# Patient Record
Sex: Female | Born: 1945 | Race: White | Hispanic: No | Marital: Married | Smoking: Never smoker
Health system: Southern US, Community
[De-identification: ages and names within clinical notes are randomized; demographics above are authoritative.]

## PROBLEM LIST (undated history)

## (undated) DIAGNOSIS — H547 Unspecified visual loss: Secondary | ICD-10-CM

## (undated) DIAGNOSIS — C801 Malignant (primary) neoplasm, unspecified: Secondary | ICD-10-CM

## (undated) DIAGNOSIS — E785 Hyperlipidemia, unspecified: Secondary | ICD-10-CM

## (undated) DIAGNOSIS — I728 Aneurysm of other specified arteries: Secondary | ICD-10-CM

## (undated) DIAGNOSIS — I10 Essential (primary) hypertension: Secondary | ICD-10-CM

## (undated) DIAGNOSIS — Z923 Personal history of irradiation: Secondary | ICD-10-CM

## (undated) HISTORY — DX: Hyperlipidemia, unspecified: E78.5

## (undated) HISTORY — DX: Aneurysm of other specified arteries: I72.8

## (undated) HISTORY — PX: EYE SURGERY: SHX253

## (undated) HISTORY — DX: Unspecified visual loss: H54.7

## (undated) HISTORY — DX: Essential (primary) hypertension: I10

## (undated) HISTORY — DX: Malignant (primary) neoplasm, unspecified: C80.1

## (undated) HISTORY — PX: BREAST LUMPECTOMY: SHX2

## (undated) HISTORY — PX: ABDOMINAL HYSTERECTOMY: SHX81

---

## 2000-07-15 ENCOUNTER — Ambulatory Visit (HOSPITAL_COMMUNITY): Admission: RE | Admit: 2000-07-15 | Discharge: 2000-07-15 | Payer: Self-pay | Admitting: Internal Medicine

## 2000-07-15 ENCOUNTER — Encounter: Payer: Self-pay | Admitting: Internal Medicine

## 2000-11-22 ENCOUNTER — Emergency Department (HOSPITAL_COMMUNITY): Admission: EM | Admit: 2000-11-22 | Discharge: 2000-11-22 | Payer: Self-pay | Admitting: Emergency Medicine

## 2000-11-22 ENCOUNTER — Encounter: Payer: Self-pay | Admitting: Emergency Medicine

## 2001-02-01 ENCOUNTER — Emergency Department (HOSPITAL_COMMUNITY): Admission: EM | Admit: 2001-02-01 | Discharge: 2001-02-01 | Payer: Self-pay | Admitting: Emergency Medicine

## 2003-03-18 ENCOUNTER — Ambulatory Visit (HOSPITAL_COMMUNITY): Admission: RE | Admit: 2003-03-18 | Discharge: 2003-03-18 | Payer: Self-pay | Admitting: Specialist

## 2003-03-27 ENCOUNTER — Inpatient Hospital Stay (HOSPITAL_COMMUNITY): Admission: EM | Admit: 2003-03-27 | Discharge: 2003-03-30 | Payer: Self-pay | Admitting: Emergency Medicine

## 2003-03-30 ENCOUNTER — Inpatient Hospital Stay (HOSPITAL_COMMUNITY): Admission: EM | Admit: 2003-03-30 | Discharge: 2003-04-01 | Payer: Self-pay | Admitting: Psychiatry

## 2004-05-20 HISTORY — PX: TONSILLECTOMY: SUR1361

## 2005-12-12 ENCOUNTER — Encounter (INDEPENDENT_AMBULATORY_CARE_PROVIDER_SITE_OTHER): Payer: Self-pay | Admitting: *Deleted

## 2005-12-12 ENCOUNTER — Observation Stay (HOSPITAL_COMMUNITY): Admission: RE | Admit: 2005-12-12 | Discharge: 2005-12-13 | Payer: Self-pay | Admitting: Otolaryngology

## 2007-02-17 ENCOUNTER — Emergency Department (HOSPITAL_COMMUNITY): Admission: EM | Admit: 2007-02-17 | Discharge: 2007-02-18 | Payer: Self-pay | Admitting: Emergency Medicine

## 2007-07-03 ENCOUNTER — Encounter: Admission: RE | Admit: 2007-07-03 | Discharge: 2007-07-03 | Payer: Self-pay | Admitting: Internal Medicine

## 2007-07-09 ENCOUNTER — Encounter: Admission: RE | Admit: 2007-07-09 | Discharge: 2007-07-09 | Payer: Self-pay | Admitting: Internal Medicine

## 2007-07-16 ENCOUNTER — Encounter (INDEPENDENT_AMBULATORY_CARE_PROVIDER_SITE_OTHER): Payer: Self-pay | Admitting: Diagnostic Radiology

## 2007-07-16 ENCOUNTER — Other Ambulatory Visit: Admission: RE | Admit: 2007-07-16 | Discharge: 2007-07-16 | Payer: Self-pay | Admitting: Diagnostic Radiology

## 2007-07-16 ENCOUNTER — Encounter: Admission: RE | Admit: 2007-07-16 | Discharge: 2007-07-16 | Payer: Self-pay | Admitting: Internal Medicine

## 2008-08-08 ENCOUNTER — Emergency Department (HOSPITAL_COMMUNITY): Admission: EM | Admit: 2008-08-08 | Discharge: 2008-08-08 | Payer: Self-pay | Admitting: Emergency Medicine

## 2008-08-17 ENCOUNTER — Ambulatory Visit (HOSPITAL_COMMUNITY): Admission: RE | Admit: 2008-08-17 | Discharge: 2008-08-17 | Payer: Self-pay | Admitting: Internal Medicine

## 2008-09-07 ENCOUNTER — Encounter: Admission: RE | Admit: 2008-09-07 | Discharge: 2008-09-07 | Payer: Self-pay | Admitting: Internal Medicine

## 2010-01-21 ENCOUNTER — Emergency Department (HOSPITAL_COMMUNITY): Admission: EM | Admit: 2010-01-21 | Discharge: 2010-01-21 | Payer: Self-pay | Admitting: Emergency Medicine

## 2010-01-21 ENCOUNTER — Encounter (INDEPENDENT_AMBULATORY_CARE_PROVIDER_SITE_OTHER): Payer: Self-pay | Admitting: Emergency Medicine

## 2010-01-21 ENCOUNTER — Ambulatory Visit: Payer: Self-pay | Admitting: Vascular Surgery

## 2010-05-20 DIAGNOSIS — Z923 Personal history of irradiation: Secondary | ICD-10-CM

## 2010-05-20 DIAGNOSIS — C50919 Malignant neoplasm of unspecified site of unspecified female breast: Secondary | ICD-10-CM

## 2010-05-20 HISTORY — DX: Personal history of irradiation: Z92.3

## 2010-05-20 HISTORY — PX: BREAST LUMPECTOMY: SHX2

## 2010-05-20 HISTORY — DX: Malignant neoplasm of unspecified site of unspecified female breast: C50.919

## 2010-08-02 LAB — BASIC METABOLIC PANEL
BUN: 17 mg/dL (ref 6–23)
CO2: 24 mEq/L (ref 19–32)
Chloride: 107 mEq/L (ref 96–112)
Creatinine, Ser: 0.98 mg/dL (ref 0.4–1.2)
GFR calc non Af Amer: 57 mL/min — ABNORMAL LOW (ref >60)
Glucose, Bld: 116 mg/dL — ABNORMAL HIGH (ref 70–99)
Potassium: 4.1 mEq/L (ref 3.5–5.1)

## 2010-08-02 LAB — URINE MICROSCOPIC-ADD ON

## 2010-08-02 LAB — CBC
HCT: 40.1 % (ref 36.0–46.0)
MCHC: 34.1 g/dL (ref 30.0–36.0)
MCV: 86.4 fL (ref 78.0–100.0)
Platelets: 242 10*3/uL (ref 150–400)
RDW: 14 % (ref 11.5–15.5)

## 2010-08-02 LAB — URINALYSIS, ROUTINE W REFLEX MICROSCOPIC
Leukocytes, UA: NEGATIVE
Nitrite: NEGATIVE
pH: 5 (ref 5.0–8.0)

## 2010-08-02 LAB — DIFFERENTIAL
Eosinophils Relative: 1 % (ref 0–5)
Lymphocytes Relative: 6 % — ABNORMAL LOW (ref 12–46)

## 2010-08-30 LAB — DIFFERENTIAL
Basophils Relative: 0 % (ref 0–1)
Eosinophils Relative: 2 % (ref 0–5)
Lymphocytes Relative: 15 % (ref 12–46)
Lymphs Abs: 1.6 10*3/uL (ref 0.7–4.0)
Neutro Abs: 8.3 10*3/uL — ABNORMAL HIGH (ref 1.7–7.7)
Neutrophils Relative %: 76 % (ref 43–77)

## 2010-08-30 LAB — URINALYSIS, ROUTINE W REFLEX MICROSCOPIC
Bilirubin Urine: NEGATIVE
Leukocytes, UA: NEGATIVE
Nitrite: NEGATIVE
Protein, ur: NEGATIVE mg/dL

## 2010-08-30 LAB — POCT I-STAT, CHEM 8
BUN: 13 mg/dL (ref 6–23)
Chloride: 103 mEq/L (ref 96–112)
HCT: 44 % (ref 36.0–46.0)
Potassium: 3.4 mEq/L — ABNORMAL LOW (ref 3.5–5.1)
Sodium: 138 mEq/L (ref 135–145)

## 2010-08-30 LAB — CBC
Hemoglobin: 14 g/dL (ref 12.0–15.0)
RDW: 13.6 % (ref 11.5–15.5)
WBC: 10.9 10*3/uL — ABNORMAL HIGH (ref 4.0–10.5)

## 2010-08-30 LAB — URINE MICROSCOPIC-ADD ON

## 2010-10-05 NOTE — H&P (Signed)
NAME:  [name], [name]                         ACCOUNT NO.:  [id number]   MEDICAL RECORD NO.:  [id number]                   PATIENT TYPE:  OUT   LOCATION:  [facility]                                 FACILITY:  [facility]   PHYSICIAN:  Alfonse Spruce, M.D.               DATE OF BIRTH:  06/30/1934   DATE OF ADMISSION:  DATE OF DISCHARGE:                                HISTORY & PHYSICAL   CHIEF COMPLAINT:  The patient took at home 84 pills of  lisinopril/hydrochlorothiazide, the 20/12.5.   HISTORY OF PRESENT ILLNESS:  [name] is a 65 year old white  female, married.  She has been very upset with her daughter who has recently  left her husband and came to live with her mom and dad, and she has not been  helping in the housework at home, as well as she has been late at night  outside the home.  The patient was very upset with the daughter.  She stated  that besides other things, that threw her to be in that state of mind, was  the low self esteem, felt hopeless to get anything done with the daughter.  She was not getting along with her husband;  however, lately she has been  okay with him at this time.   PAST MEDICAL HISTORY:  1. History of hypertension.  2. History of several eye drops, including cataract removal and glaucoma and     eye drops.   MEDICATIONS:  1. Lisinopril 20/12.5 mg one tablet q. daily.  2. Timolol 0.5% one drop each eye twice a day.  3. Pilocarpine hydrochloric 4% one drop three times a day.   HABITS:  Nonsmoker, nondrinker.   GYNECOLOGICAL HISTORY:  She is gravida 3, para 2, with one missed abortion.  Her son is 62, her daughter is 64, who returned back home and she was  married.  She is the only child.  She has no sisters or brothers.   FAMILY HISTORY:  The father died at the age of 67 with pneumonia, and mother  died at the age of 67 with a stroke.   ALLERGIES:  Unknown.   REVIEW OF SYSTEMS:  NEUROPSYCHIATRIC:  It appears the patient is depressed  with severe low self esteem and was hopeless, and ended by suicidal attempt  with the lisinopril.  No previous attempt of suicide in the past.  CARDIOVASCULAR:  No chest pain, no shortness of breath, no palpitations.  She has a history of hypertension.  PULMONARY:  She had no cough or  expectoration, no history of pneumonia.  GASTROINTESTINAL:  No hematemesis,  no melena, no hematochezia.  No history of peptic ulcer disease.  GENITOURINARY:  No dysuria or hematuria.  She has currently a Foley catheter  which is draining well with the IV fluids.  MUSCULOSKELETAL:  She was fine.  HEENT:  The eyes mainly with the vision problem and eye  drops.  The rest of  the review of the systems was essentially negative.   PHYSICAL EXAMINATION:  HEENT:  Head was normocephalic, atraumatic.  Pupils  could not be elicited with the several surgeries in the eyes.  Oropharynx  was negative with the color of the charcoal as she took two tubes of it when  she arrived.  VITAL SIGNS:  Her blood pressure was 165/60, pulse ox 99, respiratory rate  16, and pulse 80, temperature 98.6.  NECK:  Supple, no JVD, no thyromegaly, no lymphadenopathy.  CHEST:  Clear to auscultation and percussion.  HEART:  __________ with the PMI in the first intercostal space with normal  S1 and S2, no S3, no S4, no gallop.  ABDOMEN:  Soft, nontender, positive bowel sounds, no epigastric tenderness.  EXTREMITIES:  No pedal edema, pulsatile pulses with normal motion.  NEUROLOGIC:  Grossly intact.  Cranial nerves II-XII were intact.  Motor and  sensory were intact.   LABORATORY DATA:  Laboratory in the ER was sodium 137, potassium 4.2,  chloride 104, carbon dioxide 27, BUN 23, creatinine 1, blood sugar 129.  Liver enzymes within normal limits.  White count 7.9, hemoglobin 12.8,  hematocrit 38.4.   IMPRESSION:  1. Overdose on Lisinopril, hypertensive medication.  2. Hypertension.  3. History of glaucoma with using eye drops.   The  emergency room has contacted the Poison Control.   PLAN:  The patient already received two tubes of charcoal and subsequently  IV fluids which will be monitored in the intensive care unit/step-down  intensive care unit for possible hypotension.  Hopefully, if she did well by  tomorrow we will be able to move her out and ACT team was called in to see  the patient, and the future psychiatry will be a consultation and  stabilization of the patient if needed.                                               Alfonse Spruce, M.D.    Wynn Maudlin  D:  03/27/2003  T:  03/27/2003  Job:  [id number]   cc:   Janae Bridgeman. Eloise Harman., M.D.  [address]  [place]  [place] [address]  Fax: [phone]

## 2010-10-05 NOTE — Discharge Summary (Signed)
NAME:  [name], [name]                         ACCOUNT NO.:  [id number]   MEDICAL RECORD NO.:  [id number]                   PATIENT TYPE:  IPS   LOCATION:  0306                                 FACILITY:  [facility]   PHYSICIAN:  Jeanice Lim, M.D.              DATE OF BIRTH:  19-May-1946   DATE OF ADMISSION:  03/30/2003  DATE OF DISCHARGE:  04/01/2003                                 DISCHARGE SUMMARY   IDENTIFYING DATA:  This is a 65 year old Caucasian female voluntarily  admitted after an overdose on 84 tablets of lisinopril.  The patient  reported being upset after arguing with daughter, believed that she is a  Magazine features editor.  Upset that her daughter had told her, naming names, that she had  slept with 12 people and that her brief marriage did not work and this was  too much for the patient to take, although she has already gained some  insight regarding having to let her daughter control her own behavior.   MEDICATIONS:  Timolol, lisinopril and metoprolol.   ALLERGIES:  No known drug allergies.   PHYSICAL EXAMINATION:  Within normal limits.  Neurologically nonfocal.   LABORATORY DATA:  Routine admission labs within normal limits.   MENTAL STATUS EXAM:  Fully alert, in no acute distress.  Pleasant,  cooperative, tearful.  Appropriate affect.  Speech within normal limits.  Mood mildly depressed but determined to manage increasingly healthy insight  and coping skills.  Thought processes goal directed.  No acute suicidal  ideation or psychotic symptoms.  Cognitively intact.   ADMISSION DIAGNOSES:   AXIS I:  1. Adjustment disorder with mixed emotions.  2. Rule out depression not otherwise specified.   AXIS II:  Deferred.   AXIS III:  1. Status post ACE inhibitor overdose.  2. Glaucoma.  3. Hypertension.   AXIS IV:  Severe (conflict with primary support system, essentially with  tolerating daughter's behavior who lives with patient and husband).   AXIS V:  35/70.   HOSPITAL  COURSE:  The patient was admitted and ordered routine p.r.n.  medications and underwent further monitoring.  Was encouraged to participate  in individual, group and milieu therapy.  The patient reported regretting  overdose, feeling stupid, ashamed, upset at daughter's promiscuity but felt  that she had now opened up and had contacted her support system, including  her pastor, and husband was also supportive and the patient demonstrated  good insight and problem-solving skills, showing appropriate remorse and  exhibiting no acute significant depressive symptoms or history of clinical  depression or mood swings.  The patient underwent monitoring and, after no  dangerous ideation, dangerous behavior, or acute risk issues, patient was  discharged in improved condition.  Mood was more stable.  Affect brighter.  Thought processes goal directed.  Thought content negative for dangerous  ideation or psychotic symptoms.  The patient reported motivation to get  counseling.   DISCHARGE  MEDICATIONS:  The patient was discharged on no psychotropics.  Indicated that patient was to continue medical medications as previously  prescribed.  The patient and husband would be receiving therapy with the  church counselor within 3-4 days.   DISCHARGE DIAGNOSES:   AXIS I:  1. Adjustment disorder with mixed emotions.  2. Rule out depression not otherwise specified.   AXIS II:  Deferred.   AXIS III:  1. Status post ACE inhibitor overdose.  2. Glaucoma.  3. Hypertension.   AXIS IV:  Severe (conflict with primary support system, essentially with  tolerating daughter's behavior who lives with patient and husband).   AXIS V:  Global Assessment of Functioning on discharge 60.                                               Jeanice Lim, M.D.    JEM/MEDQ  D:  04/24/2003  T:  04/25/2003  Job:  [id number]

## 2010-10-05 NOTE — Discharge Summary (Signed)
   NAME:  [name], [name]                      ACCOUNT NO.:  [id number]   MEDICAL RECORD NO.:  [id number]                   PATIENT TYPE:  INP   LOCATION:  0451                                 FACILITY:  [facility]   PHYSICIAN:  Alfonse Spruce, M.D.               DATE OF BIRTH:  [birth year]   DATE OF ADMISSION:  03/27/2003  DATE OF DISCHARGE:                                 DISCHARGE SUMMARY   FINAL DIAGNOSES:  1. Suicide attempt with lisinopril.  2. Underlying history of hypertension, currently normotensive with no     medication.  3. History of cataract and glaucoma on eyedrops, using at home.   The patient at risk still per psychiatry and will be transferred to the  rehab unit per Dr. Jeanie Sewer, the psychiatrist.   HOSPITAL COURSE:  The patient is a 65 year old white female admitted after  she had a suicide attempt with swallowing lisinopril, hydrochlorothiazide,  and she had a history of hypertension in the past.  She swallowed 84 pills.  The patient was observed in the ICU followed by the medical floor and her  blood pressure stayed stable in the normotensive state and she is conscious,  alert, oriented x3, ambulatory.  She had underlying mild hypokalemia and  will be supplemented with potassium 20 mEq once a day and subsequently on  discharge will be getting only potassium two doses and then needs to be  checked subsequently on the rehab unit.  The patient currently on no  medication and the patient will receive only medication for eyedrops and two  doses of K-Dur 20 mEq equal to 40 mEq prior to discharge.  The patient  currently stable medically; however, per psychiatric evaluation needs  further inpatient rehabilitation on behavior and psychiatry.  The patient  will be on a regular diet and regular activity.  The patient will be  receiving two tablets of K-Dur each 20 mEq with a total of 40 mEq and  recommendation to be followed with serum BMET in the behavioral unit and if  needed, potassium needs to be added at that time.   DISPOSITION:  Discharge to behavioral and psychiatric floor.                                               Alfonse Spruce, M.D.    Wynn Maudlin  D:  03/29/2003  T:  03/29/2003  Job:  [id number]

## 2010-10-05 NOTE — Op Note (Signed)
NAMEAUDREENA, Walton               ACCOUNT NO.:  [id number]   MEDICAL RECORD NO.:  [id number]          PATIENT TYPE:  OBV   LOCATION:  3314                         FACILITY:  [facility]   PHYSICIAN:  Kristine Garbe. Ezzard Standing, M.D.DATE OF BIRTH:  March 09, 1946   DATE OF PROCEDURE:  12/12/2005  DATE OF DISCHARGE:                                 OPERATIVE REPORT   PREOPERATIVE DIAGNOSES:  1.  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  2.  Turbinate hypertrophy.  3.  Tonsil hypertrophy with obstructive breathing at night.   POSTOPERATIVE DIAGNOSES:  1.  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  2.  Turbinate hypertrophy.  3.  Tonsil hypertrophy with obstructive breathing at night.   OPERATIONS:  1.  Septoplasty with bilateral inferior turbinate reductions.  2.  Tonsillectomy.   SURGEON:  Kristine Garbe. Ezzard Standing, MD   ANESTHESIA:  General endotracheal.   COMPLICATIONS:  None.   BRIEF CLINICAL NOTE:  [name] is a 65 year old female who complains of  a nasal obstruction, right side worse than left, as well as noisy breathing.  At night she tends to snore and frequently wakes up.  She has clinical  symptoms of obstructive sleep apnea; although, she did not want to have a  sleep test performed.  On exam, she has a deviation of the septum to the  right with narrow right nasal passageway.  She has moderate-large cryptic  tonsils and a long uvula.  She is taken to the operating room at this time  for septoplasty, turbinate reductions, and tonsillectomy.   DESCRIPTION OF PROCEDURE:  On back with endotracheal anesthesia.  The  patient received 1 gm Ancef IV preoperatively, as well as 2 mg of Decadron.  Nose was then prepped with Betadine solution, draped down with sterile  towels, and septum was injected was Xylocaine with epinephrine.  The patient  had a deviation of the cartilaginous and bony crest to the right.  Mucoperichondrial and mucoperiosteal flaps were  elevated on the right side.  The portion of the cartilaginous septum and bony crest that bowed to the  right side were removed and the crest was fractured back to midline.  This  completed the septoplasty portion of the procedure.  The right inferior  turbinate was amputated halfway down and suction cautery was used for  hemostasis.  On the left, compensatory hypertrophy of the left inferior  turbinate, submucosal section of bone was removed and the submucosal  cauterization was performed and the turbinate was outfractured.  This  completed the nasal portion of the procedure.  Telfa soaked in bacitracin  ointment was packed in both sides of the nose.  The patient was then turned,  the mouth gag was used to displaced the oropharynx.  The patient had cryptic  tonsils with a large amount of white debris within the tonsillar crypts.  Tonsils were resected of tonsil processes bilaterally with the cautery.  After excising the tonsils, the uvula which was slightly enlarged and  elongated was amputated at its base.  This completed the procedure.  Iverna  was awoken from anesthesia  and transferred to the recovery room  postoperatively doing well.   DISPOSITION:  [name] will be observed overnight for any breathing difficulty  and planned discharge in the morning after removing nasal packs.  She will  be discharged home on amoxicillin suspension 500 mg b.i.d. for one week,  Tylenol/Lortab Elixir 1-to-1.5 tablespoons every 4 hours p.r.n. pain.  We  will have her follow up in my office in two weeks for recheck.  Of note, on  patient's preoperative chest x-ray, there is a questionable new 1-cm nodule  in the right lung base and it was recommended that she have a nonenhanced CT  scan to further evaluate this.  We will plan on scheduling this either prior  to discharge or on followup in the office.           ______________________________  Kristine Garbe. Ezzard Standing, M.D.     CEN/MEDQ  D:  12/12/2005  T:   12/13/2005  Job:  [id number]   cc:   Janae Bridgeman. Eloise Harman., M.D.  Fax: [phone]

## 2011-01-29 ENCOUNTER — Other Ambulatory Visit: Payer: Self-pay | Admitting: Internal Medicine

## 2011-01-29 DIAGNOSIS — N6320 Unspecified lump in the left breast, unspecified quadrant: Secondary | ICD-10-CM

## 2011-02-01 ENCOUNTER — Other Ambulatory Visit: Payer: Self-pay | Admitting: Diagnostic Radiology

## 2011-02-01 ENCOUNTER — Ambulatory Visit
Admission: RE | Admit: 2011-02-01 | Discharge: 2011-02-01 | Disposition: A | Discharge status: Home / Self Care | Payer: Medicare Other | Source: Ambulatory Visit | Attending: Internal Medicine | Admitting: Internal Medicine

## 2011-02-01 ENCOUNTER — Other Ambulatory Visit: Payer: Self-pay | Admitting: Internal Medicine

## 2011-02-01 DIAGNOSIS — N6320 Unspecified lump in the left breast, unspecified quadrant: Secondary | ICD-10-CM

## 2011-02-04 ENCOUNTER — Other Ambulatory Visit: Payer: Self-pay | Admitting: Internal Medicine

## 2011-02-04 DIAGNOSIS — C50912 Malignant neoplasm of unspecified site of left female breast: Secondary | ICD-10-CM

## 2011-02-08 ENCOUNTER — Ambulatory Visit
Admission: RE | Admit: 2011-02-08 | Discharge: 2011-02-08 | Disposition: A | Discharge status: Home / Self Care | Payer: Medicare Other | Source: Ambulatory Visit | Attending: Internal Medicine | Admitting: Internal Medicine

## 2011-02-08 DIAGNOSIS — C50912 Malignant neoplasm of unspecified site of left female breast: Secondary | ICD-10-CM

## 2011-02-08 MED ORDER — GADOBENATE DIMEGLUMINE 529 MG/ML IV SOLN
20.0000 mL | Freq: Once | INTRAVENOUS | Status: AC | PRN
  Administered 2011-02-08: 20 mL via INTRAVENOUS

## 2011-02-13 ENCOUNTER — Encounter (INDEPENDENT_AMBULATORY_CARE_PROVIDER_SITE_OTHER): Payer: Self-pay | Admitting: General Surgery

## 2011-02-13 ENCOUNTER — Encounter (HOSPITAL_BASED_OUTPATIENT_CLINIC_OR_DEPARTMENT_OTHER): Payer: Medicare Other | Admitting: Oncology

## 2011-02-13 ENCOUNTER — Other Ambulatory Visit (INDEPENDENT_AMBULATORY_CARE_PROVIDER_SITE_OTHER): Payer: Self-pay | Admitting: General Surgery

## 2011-02-13 ENCOUNTER — Ambulatory Visit: Payer: Medicare Other | Admitting: Physical Therapy

## 2011-02-13 ENCOUNTER — Other Ambulatory Visit: Payer: Self-pay | Admitting: Oncology

## 2011-02-13 ENCOUNTER — Ambulatory Visit (HOSPITAL_BASED_OUTPATIENT_CLINIC_OR_DEPARTMENT_OTHER): Payer: Medicare Other | Admitting: General Surgery

## 2011-02-13 VITALS — BP 111/72 | HR 69 | Temp 98.5°F | Resp 20 | Ht 65.0 in | Wt 217.4 lb

## 2011-02-13 DIAGNOSIS — C50119 Malignant neoplasm of central portion of unspecified female breast: Secondary | ICD-10-CM

## 2011-02-13 DIAGNOSIS — Z170 Estrogen receptor positive status [ER+]: Secondary | ICD-10-CM

## 2011-02-13 DIAGNOSIS — C50912 Malignant neoplasm of unspecified site of left female breast: Secondary | ICD-10-CM

## 2011-02-13 DIAGNOSIS — C50112 Malignant neoplasm of central portion of left female breast: Secondary | ICD-10-CM | POA: Insufficient documentation

## 2011-02-13 LAB — CBC WITH DIFFERENTIAL/PLATELET
Basophils Absolute: 0 10*3/uL (ref 0.0–0.1)
HCT: 38.3 % (ref 34.8–46.6)
HGB: 13.1 g/dL (ref 11.6–15.9)
LYMPH%: 21.1 % (ref 14.0–49.7)
MCHC: 34.2 g/dL (ref 31.5–36.0)
MONO#: 0.4 10*3/uL (ref 0.1–0.9)
NEUT%: 67.8 % (ref 38.4–76.8)
Platelets: 261 10*3/uL (ref 145–400)
WBC: 8.1 10*3/uL (ref 3.9–10.3)
lymph#: 1.7 10*3/uL (ref 0.9–3.3)

## 2011-02-13 LAB — COMPREHENSIVE METABOLIC PANEL
BUN: 18 mg/dL (ref 6–23)
CO2: 30 mEq/L (ref 19–32)
Calcium: 9.4 mg/dL (ref 8.4–10.5)
Chloride: 105 mEq/L (ref 96–112)
Creatinine, Ser: 0.69 mg/dL (ref 0.50–1.10)
Glucose, Bld: 157 mg/dL — ABNORMAL HIGH (ref 70–99)
Total Bilirubin: 0.2 mg/dL — ABNORMAL LOW (ref 0.3–1.2)

## 2011-02-13 LAB — CANCER ANTIGEN 27.29: CA 27.29: 33 U/mL (ref 0–39)

## 2011-02-13 NOTE — Progress Notes (Signed)
Chief Complaint  Patient presents with  . Breast Cancer    HPI [name] is a 65 y.o. female.   HPI She is referred by Dr. Zella Ball for further evaluation and treatment of a newly diagnosed invasive mammary carcinoma of the left breast. She felt a lump at the 12:00 position of her left breast. She underwent a mammogram and ultrasound demonstrating the lesion. It was at the 12:00 position approximately 6 cm from the nipple. Large core needle biopsy was performed. It demonstrated grade 2 invasive mammary carcinoma. ER/PR positive. HER-2 negative. Proliferation rate 14%.  She has an aunt on her mother side who had breast cancer. The age of her first menstrual period was 68. First child born at age 22. She is menopausal secondary to her hysterectomy. Past Medical History  Diagnosis Date  . Blind   . Hypertension   . Hyperlipidemia   . Glaucoma   . Splenic artery aneurysm     8 mm    Past Surgical History  Procedure Date  . Tonsillectomy 2006  . Cesarean section     twice  . Abdominal hysterectomy   . Eye surgery     multiple    Family History  Problem Relation Age of Onset  . Blindness    . Breast cancer Maternal Aunt   . Cancer Paternal Grandmother     Social History History  Substance Use Topics  . Smoking status: Never Smoker   . Smokeless tobacco: Not on file  . Alcohol Use: No    Allergies  Allergen Reactions  . Zithromax (Azithromycin)     Current Outpatient Prescriptions  Medication Sig Dispense Refill  . latanoprost (XALATAN) 0.005 % ophthalmic solution       . lisinopril-hydrochlorothiazide (PRINZIDE,ZESTORETIC) 20-12.5 MG per tablet daily.       Marland Kitchen lovastatin (MEVACOR) 20 MG tablet daily.       . metoprolol (LOPRESSOR) 50 MG tablet 2 (two) times daily.       . timolol (TIMOPTIC) 0.5 % ophthalmic solution         Review of Systems Review of Systems  Constitutional: Negative.   HENT: Negative.   Eyes: Positive for visual disturbance.    Respiratory: Negative.   Cardiovascular: Negative.   Gastrointestinal: Negative.   Genitourinary: Negative.   Musculoskeletal: Negative.   Neurological: Negative.   Hematological: Negative.     Blood pressure 111/72, pulse 69, temperature 98.5 F (36.9 C), resp. rate 20, height 5\' 5"  (1.651 m), weight 217 lb 6.4 oz (98.612 kg).  Physical Exam Physical Exam  Constitutional: No distress.       Overweight.  HENT:  Head: Normocephalic and atraumatic.  Eyes:       Wears glasses.  Neck: Neck supple.  Cardiovascular: Normal rate and regular rhythm.   No murmur heard. Pulmonary/Chest: Effort normal and breath sounds normal.       In the left breast, there is a 1.5-2 cm palpable mass at the 12:00 position. There is some ecchymosis around this. In the right breast, no palpable masses or nipple discharge or suspicious skin changes.  Nodes-no cervical, supraclavicular, or axillary adenopathy.  Abdominal: Soft. She exhibits no distension and no mass. There is no tenderness.  Musculoskeletal: Normal range of motion. She exhibits no edema.  Lymphadenopathy:    She has no cervical adenopathy.  Neurological: She is alert.  Skin: Skin is warm and dry. No rash noted.  Psychiatric: She has a normal mood and affect. Her behavior  is normal.    Data Reviewed Mammogram, ultrasound, MRI, pathology report.  Assessment     Invasive mammary carcinoma of left breast that is newly diagnosed. I feel she is a good candidate for breast conservation and she agrees.    Plan    Left partial mastectomy and axillary sentinel lymph node biopsy.  I have explained the procedure, risks, and aftercare.  The risks include but are not limited to bleeding, infection, wound problems, seroma formation, anesthesia, nerve injury, lymphedema, need for reexcision or removal of more lymph nodes at a later time.  She seems to understand and agrees with the plan.       Nichola Warren J 02/13/2011, 12:19 PM

## 2011-02-13 NOTE — Patient Instructions (Signed)
My office will contact you to schedule surgery

## 2011-02-25 ENCOUNTER — Encounter (HOSPITAL_COMMUNITY)
Admission: RE | Admit: 2011-02-25 | Discharge: 2011-02-25 | Disposition: A | Discharge status: Home / Self Care | Payer: Medicare Other | Source: Ambulatory Visit | Attending: General Surgery | Admitting: General Surgery

## 2011-02-25 ENCOUNTER — Other Ambulatory Visit (INDEPENDENT_AMBULATORY_CARE_PROVIDER_SITE_OTHER): Payer: Self-pay | Admitting: General Surgery

## 2011-02-25 ENCOUNTER — Ambulatory Visit (HOSPITAL_COMMUNITY)
Admission: RE | Admit: 2011-02-25 | Discharge: 2011-02-25 | Disposition: A | Discharge status: Home / Self Care | Payer: Medicare Other | Source: Ambulatory Visit | Attending: General Surgery | Admitting: General Surgery

## 2011-02-25 DIAGNOSIS — Z01812 Encounter for preprocedural laboratory examination: Secondary | ICD-10-CM | POA: Insufficient documentation

## 2011-02-25 DIAGNOSIS — Z01810 Encounter for preprocedural cardiovascular examination: Secondary | ICD-10-CM | POA: Insufficient documentation

## 2011-02-25 DIAGNOSIS — C50912 Malignant neoplasm of unspecified site of left female breast: Secondary | ICD-10-CM

## 2011-02-25 DIAGNOSIS — I10 Essential (primary) hypertension: Secondary | ICD-10-CM | POA: Insufficient documentation

## 2011-02-25 DIAGNOSIS — Z01818 Encounter for other preprocedural examination: Secondary | ICD-10-CM | POA: Insufficient documentation

## 2011-02-25 LAB — SURGICAL PCR SCREEN
MRSA, PCR: NEGATIVE
Staphylococcus aureus: NEGATIVE

## 2011-02-28 ENCOUNTER — Ambulatory Visit (HOSPITAL_COMMUNITY)
Admission: RE | Admit: 2011-02-28 | Discharge: 2011-02-28 | Disposition: A | Discharge status: Home / Self Care | Payer: Medicare Other | Source: Ambulatory Visit | Attending: General Surgery | Admitting: General Surgery

## 2011-02-28 ENCOUNTER — Other Ambulatory Visit (INDEPENDENT_AMBULATORY_CARE_PROVIDER_SITE_OTHER): Payer: Self-pay | Admitting: General Surgery

## 2011-02-28 DIAGNOSIS — Z01818 Encounter for other preprocedural examination: Secondary | ICD-10-CM | POA: Insufficient documentation

## 2011-02-28 DIAGNOSIS — Z01812 Encounter for preprocedural laboratory examination: Secondary | ICD-10-CM | POA: Insufficient documentation

## 2011-02-28 DIAGNOSIS — Z01810 Encounter for preprocedural cardiovascular examination: Secondary | ICD-10-CM | POA: Insufficient documentation

## 2011-02-28 DIAGNOSIS — I10 Essential (primary) hypertension: Secondary | ICD-10-CM | POA: Insufficient documentation

## 2011-02-28 DIAGNOSIS — C50912 Malignant neoplasm of unspecified site of left female breast: Secondary | ICD-10-CM

## 2011-02-28 DIAGNOSIS — C50919 Malignant neoplasm of unspecified site of unspecified female breast: Secondary | ICD-10-CM

## 2011-02-28 DIAGNOSIS — C50119 Malignant neoplasm of central portion of unspecified female breast: Secondary | ICD-10-CM | POA: Insufficient documentation

## 2011-02-28 HISTORY — PX: BREAST SURGERY: SHX581

## 2011-02-28 LAB — BASIC METABOLIC PANEL
CO2: 29 mEq/L (ref 19–32)
Calcium: 9.7 mg/dL (ref 8.4–10.5)
GFR calc non Af Amer: 58 mL/min — ABNORMAL LOW (ref >90)
Potassium: 3.4 mEq/L — ABNORMAL LOW (ref 3.5–5.1)
Sodium: 143 mEq/L (ref 135–145)

## 2011-02-28 LAB — DIFFERENTIAL
Basophils Absolute: 0.1 10*3/uL (ref 0.0–0.1)
Basophils Relative: 1 % (ref 0–1)
Eosinophils Absolute: 0.3 10*3/uL (ref 0.0–0.7)
Monocytes Relative: 7 % (ref 3–12)
Neutro Abs: 5.5 10*3/uL (ref 1.7–7.7)
Neutrophils Relative %: 67 % (ref 43–77)

## 2011-02-28 LAB — CBC
Hemoglobin: 12.9 g/dL (ref 12.0–15.0)
MCH: 28.5 pg (ref 26.0–34.0)
Platelets: 268 10*3/uL (ref 150–400)
RBC: 4.53 MIL/uL (ref 3.87–5.11)
WBC: 8.3 10*3/uL (ref 4.0–10.5)

## 2011-02-28 MED ORDER — TECHNETIUM TC 99M SULFUR COLLOID FILTERED
1.0000 | Freq: Once | INTRAVENOUS | Status: AC | PRN
  Administered 2011-02-28: 1 via INTRADERMAL

## 2011-02-28 NOTE — Op Note (Signed)
[name], [name]               ACCOUNT NO.:  [id number]  MEDICAL RECORD NO.:  [id number]  LOCATION:  SDSC                         FACILITY:  [facility]  PHYSICIAN:  Adolph Pollack, M.D.DATE OF BIRTH:  [birth year]  DATE OF PROCEDURE:  02/28/2011 DATE OF DISCHARGE:                              OPERATIVE REPORT   PREOPERATIVE DIAGNOSIS:  Invasive mammary carcinoma of the left breast.  POSTOPERATIVE DIAGNOSIS:  Invasive mammary carcinoma of the left breast.  PROCEDURE:  Left axillary lymphatic mapping.  Left axillary sentinel lymph node biopsy.  Left partial mastectomy.  SURGEON:  Adolph Pollack, MD  ANESTHESIA:  General plus Marcaine local.  INDICATION:  [name] is a 65 year old female who felt a mass at the 12 o'clock position of her left breast.  Thorough evaluation was performed including a large core biopsy demonstrating invasive mammary carcinoma.  She now presents for the above procedure.  The procedure andrisks were discussed with her as well as the fact this would significantly help treat this disease.  TECHNIQUE:  I saw her in the holding room exam under and marked her left breast with my initials.  Injection of radioactive material was performed in the circumareolar area of the left breast.  She was then brought to the operating room, placed supine on the operating table. General anesthetic was administered.  Performed lymphatic mapping with the Neoprobe and noted area of increased counts in the inferior aspect of the left axilla.  The left breast, chest, axilla and proximal left arm was sterilely prepped and draped.  A transverse incision in the left axilla and carried through the subcutaneous tissue.  I used the Neoprobe and identified an area of increased counts.  I used blunt dissection to separate the subcutaneous tissues.  An enlarged palpable lymph node was noted with increased counts.  This was excised with electrocautery.  This was sent  as sentinel lymph node #1.  Adjacent to it was another enlarged lymph node that did not have increased counts and I excised this and sent this as an enlarged lymph node left axilla.  Following this, I put the Neoprobe back into the axilla and no increased counts were noted.  I controlled bleeding with electrocautery.  The tissue was anesthetized with lidocaine solution.  Once hemostasis was adequate, I then closed the subcutaneous tissue with interrupted 3-0 Vicryl sutures.  The skin was closed with a 4-0 Monocryl subcuticular stitch.  Next, I addressed the breast  lesion in 11-12 o'clock position. I made an elliptical incision around the skin just directly above the mass and extended it both medially and laterally.  I created a subcutaneous flaps using electrocautery down to the chest wall.  I then used the electrocautery to excise the breast mass a normal surrounding tissue.  After removal of the breast mass, I oriented it with a skin anterior, a single suture medial, double suture inferior.  I then performed a Faxitron and clipped from the biopsy site was in the middle of the mass.  This was sent to pathology.  I then inspected the wound and controlled bleeding with electrocautery. Once hemostasis was adequate, I then mobilized some of the breast tissue off the  muscle.  I then approximated the subcutaneous tissue, deep and superficially with interrupted 3-0 Vicryl sutures.  The skin was closed with a 4-0 Monocryl subcuticular stitch.  Local anesthetic was infiltrated.  Steri-Strips and sterile dressings were applied to both wounds.  She tolerated the procedures well without any apparent complications and was taken to recovery in satisfactory condition.     Adolph Pollack, M.D.     Kari Baars  D:  02/28/2011  T:  02/28/2011  Job:  [id number]  cc:   Drue Second, M.D. Lurline Hare, M.D.  Electronically Signed by Avel Peace M.D. on 02/28/2011 12:15:22 PM

## 2011-03-04 ENCOUNTER — Telehealth (INDEPENDENT_AMBULATORY_CARE_PROVIDER_SITE_OTHER): Payer: Self-pay | Admitting: General Surgery

## 2011-03-04 NOTE — Telephone Encounter (Signed)
Pathology was discussed with her, T2N0.  She is sore but otherwise doing well.  Will have her call and make a postoperative appointment.

## 2011-03-11 ENCOUNTER — Encounter (HOSPITAL_BASED_OUTPATIENT_CLINIC_OR_DEPARTMENT_OTHER): Payer: Medicare Other | Admitting: Oncology

## 2011-03-11 DIAGNOSIS — C50919 Malignant neoplasm of unspecified site of unspecified female breast: Secondary | ICD-10-CM

## 2011-03-11 DIAGNOSIS — H548 Legal blindness, as defined in USA: Secondary | ICD-10-CM

## 2011-03-13 ENCOUNTER — Ambulatory Visit
Admission: RE | Admit: 2011-03-13 | Discharge: 2011-03-13 | Disposition: A | Discharge status: Home / Self Care | Payer: Medicare Other | Source: Ambulatory Visit | Attending: Radiation Oncology | Admitting: Radiation Oncology

## 2011-03-13 DIAGNOSIS — Z510 Encounter for antineoplastic radiation therapy: Secondary | ICD-10-CM | POA: Insufficient documentation

## 2011-03-13 DIAGNOSIS — C50119 Malignant neoplasm of central portion of unspecified female breast: Secondary | ICD-10-CM | POA: Insufficient documentation

## 2011-03-19 ENCOUNTER — Encounter (INDEPENDENT_AMBULATORY_CARE_PROVIDER_SITE_OTHER): Payer: Self-pay | Admitting: General Surgery

## 2011-03-19 ENCOUNTER — Ambulatory Visit (INDEPENDENT_AMBULATORY_CARE_PROVIDER_SITE_OTHER): Payer: Medicare Other | Admitting: General Surgery

## 2011-03-19 VITALS — BP 124/66 | HR 80 | Temp 97.3°F | Resp 16 | Ht 65.0 in | Wt 214.8 lb

## 2011-03-19 DIAGNOSIS — C50919 Malignant neoplasm of unspecified site of unspecified female breast: Secondary | ICD-10-CM

## 2011-03-19 NOTE — Progress Notes (Signed)
Operation:  Left partial mastectomy and SLNBx  Date:   02/28/11  Stage: T2N0Mx, margins clear   Hormone receptor status:    HPI:  She is here for her first postop visit.  She is doing well and has no complaints.  PE:  Left breast and axillary incisioin are clean, dry and intact.  Assessment:  T2N0 left breast cancer s/p breast conservation  Plan:  Return visit one month.  Chemo and/or XRT dates to be determined.

## 2011-03-25 ENCOUNTER — Encounter: Payer: Self-pay | Admitting: Oncology

## 2011-03-25 NOTE — Progress Notes (Unsigned)
Received Oncotype Dx results of 13.  Gave copy to MD.

## 2011-03-27 ENCOUNTER — Telehealth: Payer: Self-pay | Admitting: *Deleted

## 2011-03-27 NOTE — Telephone Encounter (Signed)
Pt called states " I spoke with [place] and they said the test was finished and they sent them to Dr. Welton Flakes and she has them as of yesterday and she would call me today. I want to know if there's a chance of it coming back, do I need chemo or just radiation." Discussed with pt MD has been in clinic today and I am unsure at this point if she has seen these results however I will inform MD of her concerns regarding the tests.

## 2011-03-28 ENCOUNTER — Telehealth: Payer: Self-pay | Admitting: Oncology

## 2011-04-03 ENCOUNTER — Ambulatory Visit
Admission: RE | Admit: 2011-04-03 | Discharge: 2011-04-03 | Disposition: A | Discharge status: Home / Self Care | Payer: Medicare Other | Source: Ambulatory Visit | Attending: Radiation Oncology | Admitting: Radiation Oncology

## 2011-04-03 ENCOUNTER — Telehealth: Payer: Self-pay | Admitting: Radiation Oncology

## 2011-04-03 DIAGNOSIS — C50119 Malignant neoplasm of central portion of unspecified female breast: Secondary | ICD-10-CM

## 2011-04-03 NOTE — Telephone Encounter (Signed)
Met with patient to discuss RO billing. °

## 2011-04-03 NOTE — Progress Notes (Signed)
[facility] Radiation Oncology Simulation and Treatment Planning Note   Name: [name] MRN: [id number]  Date: 04/03/2011  DOB: 09/02/1945  Status:outpatient    DIAGNOSIS: Breast cancer   SIDE: left   CONSENT VERIFIED:yes   SET UP: Patient is setup supine   IMMOBILIZATION: Following immobilization is used:Custom Moldable Pillow   NARRATIVE:The patient was brought to the CT Simulation planning suite.  Identity was confirmed.  All relevant records and images related to the planned course of therapy were reviewed.  Then, the patient was positioned in a stable reproducible clinical set-up for radiation therapy.  CT images were obtained.  Skin markings were placed.  The CT images were loaded into the planning software where the target and avoidance structures were contoured.  The radiation prescription was entered and confirmed.   TREATMENT PLANNING NOTE:  Treatment planning then occurred. I have requested : MLC's, an isodose plan and dose calculation

## 2011-04-10 ENCOUNTER — Ambulatory Visit
Admission: RE | Admit: 2011-04-10 | Discharge: 2011-04-10 | Disposition: A | Discharge status: Home / Self Care | Payer: Medicare Other | Source: Ambulatory Visit | Attending: Radiation Oncology | Admitting: Radiation Oncology

## 2011-04-10 DIAGNOSIS — C50119 Malignant neoplasm of central portion of unspecified female breast: Secondary | ICD-10-CM

## 2011-04-15 ENCOUNTER — Ambulatory Visit
Admission: RE | Admit: 2011-04-15 | Discharge: 2011-04-15 | Disposition: A | Discharge status: Home / Self Care | Payer: Medicare Other | Source: Ambulatory Visit | Attending: Radiation Oncology | Admitting: Radiation Oncology

## 2011-04-16 ENCOUNTER — Ambulatory Visit
Admission: RE | Admit: 2011-04-16 | Discharge: 2011-04-16 | Disposition: A | Discharge status: Home / Self Care | Payer: Medicare Other | Source: Ambulatory Visit | Attending: Radiation Oncology | Admitting: Radiation Oncology

## 2011-04-16 ENCOUNTER — Encounter: Payer: Self-pay | Admitting: Radiation Oncology

## 2011-04-16 VITALS — BP 107/62 | HR 71 | Resp 18 | Wt 224.5 lb

## 2011-04-16 DIAGNOSIS — C50119 Malignant neoplasm of central portion of unspecified female breast: Secondary | ICD-10-CM

## 2011-04-16 NOTE — Progress Notes (Signed)
Weekly Management Note Current Dose: 3.6   Gy  Projected Dose:  61 Gy   Narrative:  The patient presents for routine under treatment assessment.  CBCT/MVCT images/Port film x-rays were reviewed.  The chart was checked. Doing well. Would like to have post sim ed tomorrow due to friend waiting for her.   Physical Findings: Weight: 224 lb 8 oz (101.833 kg). Unchanged. Alert and oriented.  Impression:  The patient is tolerating radiation.  Plan:  Continue treatment as planned. RN education tomorrow.

## 2011-04-16 NOTE — Progress Notes (Signed)
3:48 PM Patient presents to the clinic today unaccompanied for under treat visit with Dr. Michell Heinrich. No distress noted. Steady gait noted. Pleasant affect noted. Patient denies pain at this time. Patient has no complaints. Will schedule patient for post sim education.

## 2011-04-17 ENCOUNTER — Ambulatory Visit
Admission: RE | Admit: 2011-04-17 | Discharge: 2011-04-17 | Disposition: A | Discharge status: Home / Self Care | Payer: Medicare Other | Source: Ambulatory Visit | Attending: Radiation Oncology | Admitting: Radiation Oncology

## 2011-04-17 DIAGNOSIS — C50119 Malignant neoplasm of central portion of unspecified female breast: Secondary | ICD-10-CM

## 2011-04-17 MED ORDER — ALRA NON-METALLIC DEODORANT (RAD-ONC)
1.0000 "application " | Freq: Once | TOPICAL | Status: AC
  Administered 2011-04-17: 1 via TOPICAL

## 2011-04-17 MED ORDER — RADIAPLEXRX EX GEL
Freq: Once | CUTANEOUS | Status: AC
  Administered 2011-04-17: 1 via TOPICAL

## 2011-04-17 NOTE — Progress Notes (Signed)
Radiation therapy and you book, flyer on skin products, radiaplex gel, alra deodorant given to pt along with printed calender schedule for pt, pt gave verbal understanding, called pt's ride [place], [phone], pt ready to be picked up

## 2011-04-18 ENCOUNTER — Ambulatory Visit
Admission: RE | Admit: 2011-04-18 | Discharge: 2011-04-18 | Disposition: A | Discharge status: Home / Self Care | Payer: Medicare Other | Source: Ambulatory Visit | Attending: Radiation Oncology | Admitting: Radiation Oncology

## 2011-04-19 ENCOUNTER — Ambulatory Visit
Admission: RE | Admit: 2011-04-19 | Discharge: 2011-04-19 | Disposition: A | Discharge status: Home / Self Care | Payer: Medicare Other | Source: Ambulatory Visit | Attending: Radiation Oncology | Admitting: Radiation Oncology

## 2011-04-22 ENCOUNTER — Ambulatory Visit
Admission: RE | Admit: 2011-04-22 | Discharge: 2011-04-22 | Disposition: A | Discharge status: Home / Self Care | Payer: Medicare Other | Source: Ambulatory Visit | Attending: Radiation Oncology | Admitting: Radiation Oncology

## 2011-04-22 ENCOUNTER — Encounter (INDEPENDENT_AMBULATORY_CARE_PROVIDER_SITE_OTHER): Payer: Medicare Other | Admitting: General Surgery

## 2011-04-23 ENCOUNTER — Ambulatory Visit
Admission: RE | Admit: 2011-04-23 | Discharge: 2011-04-23 | Disposition: A | Discharge status: Home / Self Care | Payer: Medicare Other | Source: Ambulatory Visit | Attending: Radiation Oncology | Admitting: Radiation Oncology

## 2011-04-23 DIAGNOSIS — C50119 Malignant neoplasm of central portion of unspecified female breast: Secondary | ICD-10-CM

## 2011-04-23 NOTE — Progress Notes (Signed)
Weekly Management Note Current Dose: 12.6  Gy  Projected Dose: 61 Gy   Narrative:  The patient presents for routine under treatment assessment.  CBCT/MVCT images/Port film x-rays were reviewed.  The chart was checked. Felt a "lump" superior to her scar and was concerned this could be a radiation induced cancer after reading about that. Having problems sleeping on that side at night.   Physical Findings: Weight: 220 lb 12.8 oz (100.154 kg). Unchanged physical exam.  Dense and hard scar tissue palpable in and around her scar.   Impression:  The patient is tolerating radiation.  Plan:  Continue treatment as planned. Counseled patient on time course and extreme rarity of radiation induced cancers. What she is feeling is likely scar tissue.

## 2011-04-23 NOTE — Progress Notes (Signed)
Pt states she "found a lump on her L breast yesterday". States her "breast hurts sometimes". She states she is unsure if it was her entire breast or just the lump that hurts. Hurts when she lies on L side at night. She noticed "redness of breast that went away."

## 2011-04-24 ENCOUNTER — Ambulatory Visit
Admission: RE | Admit: 2011-04-24 | Discharge: 2011-04-24 | Disposition: A | Discharge status: Home / Self Care | Payer: Medicare Other | Source: Ambulatory Visit | Attending: Radiation Oncology | Admitting: Radiation Oncology

## 2011-04-25 ENCOUNTER — Ambulatory Visit
Admission: RE | Admit: 2011-04-25 | Discharge: 2011-04-25 | Disposition: A | Discharge status: Home / Self Care | Payer: Medicare Other | Source: Ambulatory Visit | Attending: Radiation Oncology | Admitting: Radiation Oncology

## 2011-04-26 ENCOUNTER — Ambulatory Visit
Admission: RE | Admit: 2011-04-26 | Discharge: 2011-04-26 | Disposition: A | Discharge status: Home / Self Care | Payer: Medicare Other | Source: Ambulatory Visit | Attending: Radiation Oncology | Admitting: Radiation Oncology

## 2011-04-29 ENCOUNTER — Ambulatory Visit
Admission: RE | Admit: 2011-04-29 | Discharge: 2011-04-29 | Disposition: A | Discharge status: Home / Self Care | Payer: Medicare Other | Source: Ambulatory Visit | Attending: Radiation Oncology | Admitting: Radiation Oncology

## 2011-04-30 ENCOUNTER — Ambulatory Visit
Admission: RE | Admit: 2011-04-30 | Discharge: 2011-04-30 | Disposition: A | Discharge status: Home / Self Care | Payer: Medicare Other | Source: Ambulatory Visit | Attending: Radiation Oncology | Admitting: Radiation Oncology

## 2011-04-30 DIAGNOSIS — C50119 Malignant neoplasm of central portion of unspecified female breast: Secondary | ICD-10-CM

## 2011-04-30 NOTE — Telephone Encounter (Signed)
I called the patient with her oncotype score.  I explained her pathology results, next step with Radiation treatment, and then anti-estrogen oral therapy.  Pt verbalized understanding.  I gave her my direct number for further questions.

## 2011-04-30 NOTE — Progress Notes (Signed)
12/25 fractions to left breast.  Denies any pain.  Mild erythema left breast..  C/o "some" fatigue and states she sits around and watches television more.

## 2011-04-30 NOTE — Progress Notes (Signed)
Weekly Management Note Current Dose:   Gy  Projected Dose:  Gy   Narrative:  The patient presents for routine under treatment assessment.  CBCT/MVCT images/Port film x-rays were reviewed.  The chart was checked. Doing well. No complaints.   Physical Findings: Weight: 218 lb 11.2 oz (99.202 kg). Unchanged  Impression:  The patient is tolerating radiation.  Plan:  Continue treatment as planned. Continue radiaplex.

## 2011-05-01 ENCOUNTER — Ambulatory Visit
Admission: RE | Admit: 2011-05-01 | Discharge: 2011-05-01 | Disposition: A | Discharge status: Home / Self Care | Payer: Medicare Other | Source: Ambulatory Visit | Attending: Radiation Oncology | Admitting: Radiation Oncology

## 2011-05-02 ENCOUNTER — Ambulatory Visit
Admission: RE | Admit: 2011-05-02 | Discharge: 2011-05-02 | Disposition: A | Discharge status: Home / Self Care | Payer: Medicare Other | Source: Ambulatory Visit | Attending: Radiation Oncology | Admitting: Radiation Oncology

## 2011-05-03 ENCOUNTER — Ambulatory Visit
Admission: RE | Admit: 2011-05-03 | Discharge: 2011-05-03 | Disposition: A | Discharge status: Home / Self Care | Payer: Medicare Other | Source: Ambulatory Visit | Attending: Radiation Oncology | Admitting: Radiation Oncology

## 2011-05-06 ENCOUNTER — Ambulatory Visit
Admission: RE | Admit: 2011-05-06 | Discharge: 2011-05-06 | Disposition: A | Discharge status: Home / Self Care | Payer: Medicare Other | Source: Ambulatory Visit | Attending: Radiation Oncology | Admitting: Radiation Oncology

## 2011-05-07 ENCOUNTER — Ambulatory Visit
Admission: RE | Admit: 2011-05-07 | Discharge: 2011-05-07 | Disposition: A | Discharge status: Home / Self Care | Payer: Medicare Other | Source: Ambulatory Visit | Attending: Radiation Oncology | Admitting: Radiation Oncology

## 2011-05-07 DIAGNOSIS — C50119 Malignant neoplasm of central portion of unspecified female breast: Secondary | ICD-10-CM

## 2011-05-07 NOTE — Progress Notes (Signed)
Completed 17 of 25 radiation treatments to left breast. Slightly pink. No concerns today.

## 2011-05-08 ENCOUNTER — Ambulatory Visit
Admission: RE | Admit: 2011-05-08 | Discharge: 2011-05-08 | Disposition: A | Discharge status: Home / Self Care | Payer: Medicare Other | Source: Ambulatory Visit | Attending: Radiation Oncology | Admitting: Radiation Oncology

## 2011-05-08 MED ORDER — RADIAPLEXRX EX GEL
Freq: Once | CUTANEOUS | Status: AC
  Administered 2011-05-08: 10:00:00 via TOPICAL

## 2011-05-08 NOTE — Progress Notes (Signed)
Encounter addended by: Tessa Lerner, RN on: 05/08/2011 10:21 AM<BR>     Documentation filed: Inpatient MAR

## 2011-05-08 NOTE — Progress Notes (Signed)
Encounter addended by: Tessa Lerner, RN on: 05/08/2011 10:18 AM<BR>     Documentation filed: Orders

## 2011-05-08 NOTE — Progress Notes (Signed)
DIAGNOSIS:  Left breast cancer.  NARRATIVE:  [name] is seen today for weekly assessment.  She has completed 3060 cGy of a planned 6100 cGy directed at the left breast area.  The patient has a sensitivity to the breast area, but no significant pain or pruritus.  PHYSICAL EXAMINATION:  Respiratory: The patient's lungs are clear. Cardiovascular: The heart has a regular rhythm and rate.  Breasts: The left breast area shows some erythema and hyperpigmentation changes, but no moist desquamation.  IMPRESSION AND PLAN:  The patient is tolerating her radiation treatments reasonably well.  The patient's radiation fields are setting up accurately.  The patient's radiation chart was checked today.  Plan is to continue to a cumulative dose of 6100 cGy as breast conservation therapy.    ______________________________ Billie Lade, Ph.D., M.D. JDK/MEDQ  D:  05/07/2011  T:  05/08/2011  Job:  [id number]

## 2011-05-09 ENCOUNTER — Ambulatory Visit
Admission: RE | Admit: 2011-05-09 | Discharge: 2011-05-09 | Disposition: A | Discharge status: Home / Self Care | Payer: Medicare Other | Source: Ambulatory Visit | Attending: Radiation Oncology | Admitting: Radiation Oncology

## 2011-05-10 ENCOUNTER — Ambulatory Visit
Admission: RE | Admit: 2011-05-10 | Discharge: 2011-05-10 | Disposition: A | Discharge status: Home / Self Care | Payer: Medicare Other | Source: Ambulatory Visit | Attending: Radiation Oncology | Admitting: Radiation Oncology

## 2011-05-13 ENCOUNTER — Ambulatory Visit
Admission: RE | Admit: 2011-05-13 | Discharge: 2011-05-13 | Disposition: A | Discharge status: Home / Self Care | Payer: Medicare Other | Source: Ambulatory Visit | Attending: Radiation Oncology | Admitting: Radiation Oncology

## 2011-05-15 ENCOUNTER — Ambulatory Visit
Admission: RE | Admit: 2011-05-15 | Discharge: 2011-05-15 | Disposition: A | Discharge status: Home / Self Care | Payer: Medicare Other | Source: Ambulatory Visit | Attending: Radiation Oncology | Admitting: Radiation Oncology

## 2011-05-15 ENCOUNTER — Encounter: Payer: Self-pay | Admitting: Radiation Oncology

## 2011-05-15 ENCOUNTER — Encounter: Payer: Self-pay | Admitting: *Deleted

## 2011-05-15 DIAGNOSIS — C50119 Malignant neoplasm of central portion of unspecified female breast: Secondary | ICD-10-CM

## 2011-05-15 NOTE — Progress Notes (Signed)
Pt seen in treatment area by Dr. Michell Heinrich, not sent to nursing, no c/o

## 2011-05-15 NOTE — Progress Notes (Signed)
Weekly Management Note Current Dose: 39.6  Gy  Projected Dose: 60.4 Gy   Narrative:  The patient presents for routine under treatment assessment.  CBCT/MVCT images/Port film x-rays were reviewed.  The chart was checked. Checked ebeam boost on machine. No complaints.   Physical Findings: Unchanged. Dermatitis medially.   Impression:  The patient is tolerating radiation.  Plan:  Continue treatment as planned. Can use hydrocortisone medially. Continue radiaplex.

## 2011-05-16 ENCOUNTER — Ambulatory Visit
Admission: RE | Admit: 2011-05-16 | Discharge: 2011-05-16 | Disposition: A | Discharge status: Home / Self Care | Payer: Medicare Other | Source: Ambulatory Visit | Attending: Radiation Oncology | Admitting: Radiation Oncology

## 2011-05-17 ENCOUNTER — Ambulatory Visit
Admission: RE | Admit: 2011-05-17 | Discharge: 2011-05-17 | Disposition: A | Discharge status: Home / Self Care | Payer: Medicare Other | Source: Ambulatory Visit | Attending: Radiation Oncology | Admitting: Radiation Oncology

## 2011-05-19 ENCOUNTER — Telehealth: Payer: Self-pay | Admitting: Oncology

## 2011-05-19 NOTE — Telephone Encounter (Signed)
sw the pt and she is aware of her feb 2013 appts

## 2011-05-20 ENCOUNTER — Ambulatory Visit
Admission: RE | Admit: 2011-05-20 | Discharge: 2011-05-20 | Disposition: A | Discharge status: Home / Self Care | Payer: Medicare Other | Source: Ambulatory Visit | Attending: Radiation Oncology | Admitting: Radiation Oncology

## 2011-05-20 ENCOUNTER — Ambulatory Visit: Payer: Medicare Other

## 2011-05-21 ENCOUNTER — Ambulatory Visit: Payer: Medicare Other

## 2011-05-22 ENCOUNTER — Ambulatory Visit
Admission: RE | Admit: 2011-05-22 | Discharge: 2011-05-22 | Disposition: A | Discharge status: Home / Self Care | Payer: Medicare Other | Source: Ambulatory Visit | Attending: Radiation Oncology | Admitting: Radiation Oncology

## 2011-05-22 DIAGNOSIS — C50119 Malignant neoplasm of central portion of unspecified female breast: Secondary | ICD-10-CM

## 2011-05-22 NOTE — Progress Notes (Signed)
C/O ITCHING , BURNING AND PAIN ON ALL AREAS OF TX, NOTED SMALL AREA OF DESQUAMATION UNDER BREAST.  USING RADIAPLEX.  ALSO C/O NEW DISCOMFORT UNDER ARM.

## 2011-05-22 NOTE — Progress Notes (Signed)
Weekly Management Note Current Dose: 47  Gy  Projected Dose: 61 Gy   Narrative:  The patient presents for routine under treatment assessment.  CBCT/MVCT images/Port film x-rays were reviewed.  The chart was checked. C/o irritaiton in axilla, small open sore underbreast and itching medially. Started boost today.   Physical Findings: Weight: 222 lb (100.699 kg). Moist desquamation under left breast. Dry desquamation in axilla. Some dermatitis medially.   Impression:  The patient is tolerating radiation.  Plan:  Continue treatment as planned. Hydrocortisone to medial breast for itching. Neosporin plus pain to inframammary fold. On boost now so hopefully skin will heal.

## 2011-05-23 ENCOUNTER — Ambulatory Visit
Admission: RE | Admit: 2011-05-23 | Discharge: 2011-05-23 | Disposition: A | Discharge status: Home / Self Care | Payer: Medicare Other | Source: Ambulatory Visit | Attending: Radiation Oncology | Admitting: Radiation Oncology

## 2011-05-24 ENCOUNTER — Ambulatory Visit
Admission: RE | Admit: 2011-05-24 | Discharge: 2011-05-24 | Disposition: A | Discharge status: Home / Self Care | Payer: Medicare Other | Source: Ambulatory Visit | Attending: Radiation Oncology | Admitting: Radiation Oncology

## 2011-05-27 ENCOUNTER — Ambulatory Visit
Admission: RE | Admit: 2011-05-27 | Discharge: 2011-05-27 | Disposition: A | Discharge status: Home / Self Care | Payer: Medicare Other | Source: Ambulatory Visit | Attending: Radiation Oncology | Admitting: Radiation Oncology

## 2011-05-28 ENCOUNTER — Ambulatory Visit
Admission: RE | Admit: 2011-05-28 | Discharge: 2011-05-28 | Disposition: A | Discharge status: Home / Self Care | Payer: Medicare Other | Source: Ambulatory Visit | Attending: Radiation Oncology | Admitting: Radiation Oncology

## 2011-05-28 DIAGNOSIS — C50119 Malignant neoplasm of central portion of unspecified female breast: Secondary | ICD-10-CM

## 2011-05-28 NOTE — Progress Notes (Signed)
Weekly Management Note Current Dose: 55  Gy  Projected Dose: 61 Gy   Narrative:  The patient presents for routine under treatment assessment.  CBCT/MVCT images/Port film x-rays were reviewed.  The chart was checked. Doing well. Moist desquamation feeling better. Using neosporin with pain.   Physical Findings: Weight: 223 lb 11.2 oz (101.47 kg). Unchanged  Impression:  The patient is tolerating radiation.  Plan:  Continue treatment as planned. F/u in 1 month. Discussed lotion with vit e. Has f/u with khan feb 8.

## 2011-05-28 NOTE — Progress Notes (Signed)
C/O PAIN 3/10 MORE ON BOOST AREA, NOTED BRIGHT RED SKIN WITH SOME WET DESQUAMATION UNDER BREAST, ALSO DRY DESQUAMATION UNDER ARM

## 2011-05-29 ENCOUNTER — Ambulatory Visit
Admission: RE | Admit: 2011-05-29 | Discharge: 2011-05-29 | Disposition: A | Discharge status: Home / Self Care | Payer: Medicare Other | Source: Ambulatory Visit | Attending: Radiation Oncology | Admitting: Radiation Oncology

## 2011-05-30 ENCOUNTER — Ambulatory Visit
Admission: RE | Admit: 2011-05-30 | Discharge: 2011-05-30 | Disposition: A | Discharge status: Home / Self Care | Payer: Medicare Other | Source: Ambulatory Visit | Attending: Radiation Oncology | Admitting: Radiation Oncology

## 2011-05-31 ENCOUNTER — Ambulatory Visit
Admission: RE | Admit: 2011-05-31 | Discharge: 2011-05-31 | Disposition: A | Discharge status: Home / Self Care | Payer: Medicare Other | Source: Ambulatory Visit | Attending: Radiation Oncology | Admitting: Radiation Oncology

## 2011-05-31 ENCOUNTER — Encounter: Payer: Self-pay | Admitting: Radiation Oncology

## 2011-05-31 DIAGNOSIS — C50119 Malignant neoplasm of central portion of unspecified female breast: Secondary | ICD-10-CM

## 2011-05-31 NOTE — Progress Notes (Signed)
Weekly Management Note Current Dose:  61 Gy  Projected Dose:  61 Gy   Narrative:  The patient presents for routine under treatment assessment.  CBCT/MVCT images/Port film x-rays were reviewed.  The chart was checked. Skin in boost is sore. Rest of breast is healing.   Physical Findings:   Erythema in boost area. Rest of breast is brown.   Impression:  The patient is tolerating radiation.  Plan:  Finishes RT today. F/u in 1 month.

## 2011-05-31 NOTE — Progress Notes (Signed)
Pt completed tx today, gave FU appt card. Per Dr Michell Heinrich, pt instructed to use radiaplex x 2 wks then buy lotion w/vit E. No c/o today.

## 2011-06-25 ENCOUNTER — Ambulatory Visit (INDEPENDENT_AMBULATORY_CARE_PROVIDER_SITE_OTHER): Payer: Medicare Other | Admitting: General Surgery

## 2011-06-25 ENCOUNTER — Encounter (INDEPENDENT_AMBULATORY_CARE_PROVIDER_SITE_OTHER): Payer: Self-pay | Admitting: General Surgery

## 2011-06-25 VITALS — BP 136/80 | HR 70 | Temp 97.8°F | Resp 18 | Ht 65.5 in | Wt 217.4 lb

## 2011-06-25 DIAGNOSIS — Z853 Personal history of malignant neoplasm of breast: Secondary | ICD-10-CM

## 2011-06-25 NOTE — Patient Instructions (Signed)
Call if you find any lumps in your breasts. 

## 2011-06-25 NOTE — Progress Notes (Signed)
Operation: Left partial mastectomy and sentinel lymph node biopsy  Date:  February 28, 2011  Stage: T2N0  Hormone receptor status:  Positive  HPI:  She is here for followup of her left breast cancer. She has completed her radiation treatments. She says of the scar is firm. No adenopathy.  PE: Left breast-superior scar is well healed with a firm ridge present. Radiation changes to the skin and noted.  Lymph nodes-no cervical, supraclavicular, or axillary adenopathy.  Assessment:  Left breast cancer status post breast conservation therapy and radiation therapy with no clinical evidence of recurrence.  Plan:  Return visit in 3 months.

## 2011-06-28 ENCOUNTER — Other Ambulatory Visit (HOSPITAL_BASED_OUTPATIENT_CLINIC_OR_DEPARTMENT_OTHER): Payer: Medicare Other | Admitting: Lab

## 2011-06-28 ENCOUNTER — Ambulatory Visit (HOSPITAL_BASED_OUTPATIENT_CLINIC_OR_DEPARTMENT_OTHER): Payer: Medicare Other | Admitting: Oncology

## 2011-06-28 VITALS — BP 103/68 | HR 66 | Temp 98.5°F | Ht 65.5 in | Wt 219.5 lb

## 2011-06-28 DIAGNOSIS — Z79811 Long term (current) use of aromatase inhibitors: Secondary | ICD-10-CM

## 2011-06-28 DIAGNOSIS — C50919 Malignant neoplasm of unspecified site of unspecified female breast: Secondary | ICD-10-CM

## 2011-06-28 DIAGNOSIS — Z170 Estrogen receptor positive status [ER+]: Secondary | ICD-10-CM

## 2011-06-28 LAB — CBC WITH DIFFERENTIAL/PLATELET
Basophils Absolute: 0 10*3/uL (ref 0.0–0.1)
Eosinophils Absolute: 0.2 10*3/uL (ref 0.0–0.5)
HGB: 13.2 g/dL (ref 11.6–15.9)
MONO#: 0.4 10*3/uL (ref 0.1–0.9)
NEUT#: 5.6 10*3/uL (ref 1.5–6.5)
Platelets: 248 10*3/uL (ref 145–400)
RBC: 4.59 10*6/uL (ref 3.70–5.45)
RDW: 14.4 % (ref 11.2–14.5)
WBC: 7.3 10*3/uL (ref 3.9–10.3)

## 2011-06-28 LAB — COMPREHENSIVE METABOLIC PANEL
Albumin: 3.9 g/dL (ref 3.5–5.2)
CO2: 26 mEq/L (ref 19–32)
Calcium: 9.3 mg/dL (ref 8.4–10.5)
Glucose, Bld: 120 mg/dL — ABNORMAL HIGH (ref 70–99)
Potassium: 3.8 mEq/L (ref 3.5–5.3)
Sodium: 141 mEq/L (ref 135–145)
Total Protein: 6.2 g/dL (ref 6.0–8.3)

## 2011-06-28 MED ORDER — ANASTROZOLE 1 MG PO TABS: 1.0000 mg | ORAL_TABLET | Freq: Every day | ORAL | Status: AC

## 2011-06-28 NOTE — Patient Instructions (Signed)

## 2011-06-29 ENCOUNTER — Encounter: Payer: Self-pay | Admitting: Radiation Oncology

## 2011-06-29 DIAGNOSIS — C50119 Malignant neoplasm of central portion of unspecified female breast: Secondary | ICD-10-CM

## 2011-06-29 NOTE — Progress Notes (Signed)
[facility] Radiation Oncology Simulation Verification Note   Name: [name] MRN: [id number]   Date: 04/10/2011 DOB: 1946/03/13  Status:outpatient   DIAGNOSIS:  1. 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     POSITION: Patient was placed in the supine position on the treatment machine.  Isocenter and MLCS were reviewed and treatment was approved.  NARRATIVE: Patient tolerated simulation well.

## 2011-06-29 NOTE — Progress Notes (Signed)
Name: [name]   MRN: [id number]  Date:  04/30/2011   DOB: 1946-04-05  Status:outpatient    DIAGNOSIS: Breast cancer.  CONSENT VERIFIED: yes   SET UP: Patient is setup supine   IMMOBILIZATION:  The following immobilization was used:Custom Moldable Pillow, breast board.   NARRATIVE: [name] underwent complex simulation and treatment planning for her boost treatment on 04/30/2011.  Her tumor volume was outlined on the planning CT scan. The dept of her cavity was felt to be adequate for electron beam boost.  18  MeV electrons will be prescribed to the 93% Isodose line.   A block will be used for beam modification purposes.  A special port plan is requested.

## 2011-06-29 NOTE — Progress Notes (Signed)
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  TREATMENT DATES:  04/15/2011 to 05/31/2011.  ANATOMIC REGION TREATED: 1. Left breast. 2. Left breast boost.  DOSE: 1. 45 Gy at 1.8 Gy per fraction x25 fractions. 2. 16 Gy at 2 Gy per fraction x8 fractions.  BEAM ARRANGEMENT: 1. Opposed tangents. 2. En face electrons.  BEAM ENERGY: 1. 6 and 10 MV photons. 2. 18 MeV electrons.  TREATMENT TOLERANCE:  Harlym really tolerated her treatments very well. She had the expected skin toxicity of dry desquamation.  FOLLOWUP:  I will plan on seeing her back in 1 month's time.  She also has regular scheduled followup with Dr. Abbey Chatters and Dr. Welton Flakes.  She knows to contact us with any questions or concerns.    ______________________________ Lurline Hare, M.D. SW/MEDQ  D:  06/29/2011  T:  06/29/2011  Job:  [id number]

## 2011-06-29 NOTE — Progress Notes (Signed)
OFFICE PROGRESS NOTE  CC  Pearson Grippe, MD, MD [address] [facility] [place] [address] Dr. Lysbeth Galas   DIAGNOSIS: 66 year old female with stage II breast cancer diagnosed 10/12 s/p partial mastectomy with SNL  PRIOR THERAPY: 1. Left partial mastectomy with SNL  2. Path revealed a stage 2 (T2No) IDC, ER+ 3. S/P radiation therapy to the left breast tolerated very well  4. Start adjuvant anti-estrogen therapy with arimidex 1 mg daily   CURRENT THERAPY: Arimidex daily (1 mg)  INTERVAL HISTORY: [name] 66 y.o. female returns for follow up visit overall she is doing well and is without any significant problems. She tolerated the radiation well. She does experience some tenderness in the surgical site. No denies any nausea or vomiting, no fevers or chills. She does have fatigue. Remainder of the 10 point review of systems is negative.  MEDICAL HISTORY: Past Medical History  Diagnosis Date  . Blind   . Hypertension   . Hyperlipidemia   . Glaucoma   . Splenic artery aneurysm     8 mm    ALLERGIES:  is allergic to zithromax.  MEDICATIONS:  Current Outpatient Prescriptions  Medication Sig Dispense Refill  . anastrozole (ARIMIDEX) 1 MG tablet Take 1 tablet (1 mg total) by mouth daily.  90 tablet  12  . latanoprost (XALATAN) 0.005 % ophthalmic solution Place 1 drop into both eyes 3 (three) times daily.       Marland Kitchen lisinopril-hydrochlorothiazide (PRINZIDE,ZESTORETIC) 20-12.5 MG per tablet 1 tablet daily.       Marland Kitchen lovastatin (MEVACOR) 20 MG tablet daily.       . metoprolol (LOPRESSOR) 50 MG tablet 50 mg 2 (two) times daily.       Marland Kitchen oxyCODONE-acetaminophen (PERCOCET) 5-325 MG per tablet 1 tablet as needed.       . timolol (TIMOPTIC) 0.5 % ophthalmic solution Place 1 drop into both eyes daily.         SURGICAL HISTORY:  Past Surgical History  Procedure Date  . Tonsillectomy 2006  . Cesarean section     twice  . Abdominal hysterectomy   .  Eye surgery     multiple  . Breast surgery 02/28/11    lumpectomy - left    REVIEW OF SYSTEMS:  Pertinent items are noted in HPI.   PHYSICAL EXAMINATION: General appearance: alert, cooperative and appears stated age Neck: no adenopathy, no carotid bruit, no JVD, supple, symmetrical, trachea midline and thyroid not enlarged, symmetric, no tenderness/mass/nodules Lymph nodes: Cervical, supraclavicular, and axillary nodes normal. Resp: clear to auscultation bilaterally and normal percussion bilaterally Back: symmetric, no curvature. ROM normal. No CVA tenderness. Cardio: regular rate and rhythm, S1, S2 normal, no murmur, click, rub or gallop GI: soft, non-tender; bowel sounds normal; no masses,  no organomegaly Extremities: extremities normal, atraumatic, no cyanosis or edema Neurologic: Alert and oriented X 3, normal strength and tone. Normal symmetric reflexes. Normal coordination and gait Breast exam: left well healed surgical scar, no palpable masses nipple discharge or inversion or retraction   ECOG PERFORMANCE STATUS: 1 - Symptomatic but completely ambulatory  Blood pressure 103/68, pulse 66, temperature 98.5 F (36.9 C), temperature source Oral, height 5' 5.5" (1.664 m), weight 219 lb 8 oz (99.565 kg).  LABORATORY DATA: Lab Results  Component Value Date   WBC 7.3 06/28/2011   HGB 13.2 06/28/2011   HCT 39.3 06/28/2011   MCV 85.7 06/28/2011   PLT 248 06/28/2011  Chemistry      Component Value Date/Time   NA 141 06/28/2011 0835   K 3.8 06/28/2011 0835   CL 105 06/28/2011 0835   CO2 26 06/28/2011 0835   BUN 19 06/28/2011 0835   CREATININE 0.89 06/28/2011 0835      Component Value Date/Time   CALCIUM 9.3 06/28/2011 0835   ALKPHOS 72 06/28/2011 0835   AST 17 06/28/2011 0835   ALT 15 06/28/2011 0835   BILITOT 0.6 06/28/2011 0835       RADIOGRAPHIC STUDIES:  No results found.  ASSESSMENT: 66 year old female with: 1.Left breast cancer stage II s/p partial mastectomy with SNL. Now completed  radiation    PLAN:  1. Arimidex 1 mg daily x 5 years 2.. Risks and benefits explained completely patient demonstrates understanding 3. Follow up in 3 months. Prescription sent to her pharmacy   All questions were answered. The patient knows to call the clinic with any problems, questions or concerns. We can certainly see the patient much sooner if necessary.  I spent 25 minutes counseling the patient face to face. The total time spent in the appointment was 30 minutes.    Drue Second, MD Medical/Oncology [facility] [phone] (beeper) [phone] (Office)  06/29/2011, 4:22 PM

## 2011-07-04 ENCOUNTER — Ambulatory Visit
Admission: RE | Admit: 2011-07-04 | Discharge: 2011-07-04 | Disposition: A | Discharge status: Home / Self Care | Payer: Medicare Other | Source: Ambulatory Visit | Attending: Radiation Oncology | Admitting: Radiation Oncology

## 2011-07-04 DIAGNOSIS — C50119 Malignant neoplasm of central portion of unspecified female breast: Secondary | ICD-10-CM

## 2011-07-04 NOTE — Progress Notes (Signed)
CC:   Adolph Pollack, M.D. Massie Maroon, MD Drue Second, M.D.  DIAGNOSIS:  T2 N0 invasive ductal carcinoma of the left breast.  PREVIOUS RADIATION:  Total dose of 61 Gy completed on 05/31/2011.  INTERVAL SINCE TREATMENT:  1 month.  INTERVAL HISTORY:  [name] reports for followup today.  She picked up her prescription for Femara and is going to start that later this afternoon. She has been using lotion with vitamin E to her breast.  She has no breast-related complaints.  PHYSICAL EXAMINATION:  Skin:  She has skin darkening over the left breast.  The skin appears moist.  IMPRESSION:  T2 invasive ductal carcinoma of the left breast with resolving acute effects of treatment.  RECOMMENDATIONS:  [name] looks great.  We will plan on seeing her back on a p.r.n. basis.  She knows she can always contact me with any questions or concerns.    ______________________________ Lurline Hare, M.D. SW/MEDQ  D:  07/04/2011  T:  07/04/2011  Job:  [id number]

## 2011-07-04 NOTE — Progress Notes (Signed)
Here for routine follow up post completion of left breast ca. Continues to have hyperpigmented skin. Using Vit. E lotion. Will start arimidex today.

## 2011-07-04 NOTE — Progress Notes (Signed)
CC:   Suzanne Maroon, MD Adolph Pollack, M.D. Drue Second, M.D.  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  PREVIOUS RADIATION:  To a total dose of 61 Gy completed 05/31/2011.  INTERVAL SINCE TREATMENT:  1 month.  INTERVAL HISTORY:  [name] reports for followup today.  She is feeling well and doing well.  She has picked up her Femara, but has not started it yet.  She is pleased with her cosmetic result.  She has no pain.  PHYSICAL EXAMINATION:  She has some hyperpigmentation of the left breast.  There is no moist desquamation.  IMPRESSION:  Left breast cancer with resolving acute effects of treatment.  RECOMMENDATIONS:  She is feeling well and doing well.  I have released her from followup with me.  She has regularly scheduled followup with Dr. Welton Flakes.  She knows to contact me with any questions or concerns.    ______________________________ Lurline Hare, M.D. SW/MEDQ  D:  07/04/2011  T:  07/04/2011  Job:  [id number]

## 2011-09-10 ENCOUNTER — Encounter: Payer: Self-pay | Admitting: Family

## 2011-09-10 ENCOUNTER — Other Ambulatory Visit (HOSPITAL_BASED_OUTPATIENT_CLINIC_OR_DEPARTMENT_OTHER): Payer: Medicare Other | Admitting: Lab

## 2011-09-10 ENCOUNTER — Telehealth: Payer: Self-pay | Admitting: Oncology

## 2011-09-10 ENCOUNTER — Ambulatory Visit (HOSPITAL_BASED_OUTPATIENT_CLINIC_OR_DEPARTMENT_OTHER): Payer: Medicare Other | Admitting: Family

## 2011-09-10 VITALS — BP 116/69 | HR 75 | Temp 97.9°F | Ht 65.5 in | Wt 219.2 lb

## 2011-09-10 DIAGNOSIS — C50919 Malignant neoplasm of unspecified site of unspecified female breast: Secondary | ICD-10-CM

## 2011-09-10 DIAGNOSIS — Z170 Estrogen receptor positive status [ER+]: Secondary | ICD-10-CM

## 2011-09-10 LAB — COMPREHENSIVE METABOLIC PANEL WITH GFR
ALT: 15 U/L (ref 0–35)
AST: 15 U/L (ref 0–37)
Albumin: 4.1 g/dL (ref 3.5–5.2)
Alkaline Phosphatase: 79 U/L (ref 39–117)
BUN: 22 mg/dL (ref 6–23)
CO2: 29 meq/L (ref 19–32)
Calcium: 9.4 mg/dL (ref 8.4–10.5)
Chloride: 103 meq/L (ref 96–112)
Creatinine, Ser: 1.04 mg/dL (ref 0.50–1.10)
Glucose, Bld: 166 mg/dL — ABNORMAL HIGH (ref 70–99)
Potassium: 4 meq/L (ref 3.5–5.3)
Sodium: 141 meq/L (ref 135–145)
Total Bilirubin: 0.5 mg/dL (ref 0.3–1.2)
Total Protein: 6.1 g/dL (ref 6.0–8.3)

## 2011-09-10 LAB — CBC WITH DIFFERENTIAL/PLATELET
Eosinophils Absolute: 0.2 10*3/uL (ref 0.0–0.5)
MONO#: 0.4 10*3/uL (ref 0.1–0.9)
MONO%: 5.2 % (ref 0.0–14.0)
NEUT#: 4.8 10*3/uL (ref 1.5–6.5)
RBC: 4.57 10*6/uL (ref 3.70–5.45)
RDW: 13.8 % (ref 11.2–14.5)
WBC: 6.7 10*3/uL (ref 3.9–10.3)
lymph#: 1.3 10*3/uL (ref 0.9–3.3)
nRBC: 0 % (ref 0–0)

## 2011-09-10 NOTE — Progress Notes (Signed)
  OFFICE PROGRESS NOTE  CC: Dr. Pearson Grippe Dr. Lysbeth Galas  DIAGNOSIS: Stage II left breast cancer diagnosed 10/12. Infiltrating ductal, ER+, 0/2 lymph nodes positive.   PRIOR THERAPY: 1. Left partial mastectomy with sentinel node biopsy 02/28/11.  2. Radiation therapy to the left breast   CURRENT THERAPY: Arimidex daily (1 mg) Began Feb. 2013.   INTERVAL HISTORY:  Returns for follow up visit, overall is doing well without significant problems.No headache or blurred vision. No cough or shortness of breath. No abdominal pain or new bone pain. Bowel and bladder function are normal. Appetite is good, with adequate fluid intake. Remainder of the 10 point  review of systems is negative.    ALLERGIES:  Zithromax.  PHYSICAL EXAMINATION:  General: Well developed, well nourished, in no acute distress.  EENT: No ocular or oral lesions. No stomatitis.  Respiratory: Lungs are clear to auscultation bilaterally with normal respiratory movement and no accessory muscle use. Cardiac: No murmur, rub or tachycardia. No upper or lower extremity edema.  GI: Abdomen is soft, no palpable hepatosplenomegaly. No fluid wave. No tenderness. Musculoskeletal: No kyphosis, no tenderness over the spine, ribs or hips. Lymph: No cervical, infraclavicular, axillary or inguinal adenopathy. Neuro: No focal neurological deficits. Psych: Alert and oriented X 3, flat affect.  Breast: BREAST EXAM: In the supine position, with the right arm over the head, the right nipple is everted. No periareolar edema or nipple discharge. No mass in any quadrant or subareolar region. No redness of the skin. No right axillary adenopathy. With the left arm over the head, the left nipple is everted. Skin thickening of left areola. No periareolar edema or nipple discharge. No mass in any quadrant or subareolar region.  In the 10 - 2 o'clock position, a 7 cm remote well-healed incision. Volume deficit underlying the scar. No redness of the  skin. No left axillary adenopathy. A 7 cm remote well-healed incision in the left axilla.   ECOG PERFORMANCE STATUS: O  Blood pressure 116/69, pulse 75, temperature 97.9 F (36.6 C), height 5' 5.5" (1.664 m), weight 219 lb 3.2 oz (99.428 kg).  LABORATORY DATA: Lab Results  Component Value Date   WBC 6.7 09/10/2011   HGB 13.5 09/10/2011   HCT 40.0 09/10/2011   MCV 87.4 09/10/2011   PLT 240 09/10/2011   ASSESSMENT: 66 year old female with: 1.History left breast cancer, no evidence of recurrence by clinical exam.   2. On antiestrogen for 3 months with good tolerance.   PLAN:  1. Continue Arimidex 1 mg daily x 5 years 2. Follow up in 4 months, no lab needed.  3. Baseline bone density and mammogram in August.   All questions were answered. The patient knows to call the clinic with any problems, questions or concerns.

## 2011-09-10 NOTE — Telephone Encounter (Signed)
gve the pt her aug 2013 appt calendar along with the mammo/bone density appt

## 2011-10-16 ENCOUNTER — Encounter (INDEPENDENT_AMBULATORY_CARE_PROVIDER_SITE_OTHER): Payer: Self-pay | Admitting: General Surgery

## 2011-10-16 ENCOUNTER — Ambulatory Visit (INDEPENDENT_AMBULATORY_CARE_PROVIDER_SITE_OTHER): Payer: Medicare Other | Admitting: General Surgery

## 2011-10-16 VITALS — BP 120/80 | HR 72 | Temp 96.2°F | Resp 16 | Ht 65.0 in | Wt 217.0 lb

## 2011-10-16 DIAGNOSIS — Z853 Personal history of malignant neoplasm of breast: Secondary | ICD-10-CM

## 2011-10-16 NOTE — Patient Instructions (Signed)
Call if you find any new masses in your breasts. 

## 2011-10-16 NOTE — Progress Notes (Signed)
Operation: Left partial mastectomy and sentinel lymph node biopsy  Date:  February 28, 2011  Stage: T2N0  Hormone receptor status:  Positive  HPI:  She is here for followup of her left breast cancer. She is tolerating the Arimidex well.  She still has the firm scar. No adenopathy.  PE: Left breast-superior scar is well healed with a firm ridge present. Radiation changes to the skin and noted.  No new masses.  Right breast-soft, no dominant masses or suspicious changes.  Lymph nodes-no cervical, supraclavicular, or axillary adenopathy.  Assessment:  Left breast cancer status post breast conservation therapy and radiation therapy with no clinical evidence of recurrence.  Plan:  Return visit in 3 months.

## 2012-01-16 ENCOUNTER — Encounter: Payer: Self-pay | Admitting: Oncology

## 2012-01-16 ENCOUNTER — Telehealth: Payer: Self-pay | Admitting: Oncology

## 2012-01-16 ENCOUNTER — Ambulatory Visit (HOSPITAL_BASED_OUTPATIENT_CLINIC_OR_DEPARTMENT_OTHER): Payer: Medicare Other | Admitting: Oncology

## 2012-01-16 VITALS — BP 108/70 | HR 78 | Temp 98.6°F | Resp 20 | Ht 65.0 in | Wt 219.8 lb

## 2012-01-16 DIAGNOSIS — C50919 Malignant neoplasm of unspecified site of unspecified female breast: Secondary | ICD-10-CM

## 2012-01-16 DIAGNOSIS — Z170 Estrogen receptor positive status [ER+]: Secondary | ICD-10-CM

## 2012-01-16 DIAGNOSIS — C50119 Malignant neoplasm of central portion of unspecified female breast: Secondary | ICD-10-CM

## 2012-01-16 NOTE — Progress Notes (Signed)
  OFFICE PROGRESS NOTE  CC: Dr. Pearson Grippe Dr. Avel Peace  DIAGNOSIS: Stage II left breast cancer diagnosed 10/12. Infiltrating ductal, ER+, 0/2 lymph nodes positive.   PRIOR THERAPY: 1. Left partial mastectomy with sentinel node biopsy 02/28/11.  2. Radiation therapy to the left breast   CURRENT THERAPY: Arimidex daily (1 mg) Began Feb. 2013.   INTERVAL HISTORY:  Returns for follow up visit, overall is doing well without significant problems.No headache or blurred vision. No cough or shortness of breath. No abdominal pain or new bone pain. Bowel and bladder function are normal. Appetite is good, with adequate fluid intake. Remainder of the 10 point  review of systems is negative.    ALLERGIES:  Zithromax.  PHYSICAL EXAMINATION:  General: Well developed, well nourished, in no acute distress.  EENT: No ocular or oral lesions. No stomatitis.  Respiratory: Lungs are clear to auscultation bilaterally with normal respiratory movement and no accessory muscle use. Cardiac: No murmur, rub or tachycardia. No upper or lower extremity edema.  GI: Abdomen is soft, no palpable hepatosplenomegaly. No fluid wave. No tenderness. Musculoskeletal: No kyphosis, no tenderness over the spine, ribs or hips. Lymph: No cervical, infraclavicular, axillary or inguinal adenopathy. Neuro: No focal neurological deficits. Psych: Alert and oriented X 3, flat affect.  Breast: BREAST EXAM: In the supine position, with the right arm over the head, the right nipple is everted. No periareolar edema or nipple discharge. No mass in any quadrant or subareolar region. No redness of the skin. No right axillary adenopathy. With the left arm over the head, the left nipple is everted. Skin thickening of left areola. No periareolar edema or nipple discharge. No mass in any quadrant or subareolar region.  In the 10 - 2 o'clock position, a 7 cm remote well-healed incision. Volume deficit underlying the scar. No redness of the  skin. No left axillary adenopathy. A 7 cm remote well-healed incision in the left axilla.    ECOG PERFORMANCE STATUS: O  Blood pressure 108/70, pulse 78, temperature 98.6 F (37 C), resp. rate 20, height 5\' 5"  (1.651 m), weight 219 lb 12.8 oz (99.701 kg).  LABORATORY DATA: Lab Results  Component Value Date   WBC 6.7 09/10/2011   HGB 13.5 09/10/2011   HCT 40.0 09/10/2011   MCV 87.4 09/10/2011   PLT 240 09/10/2011   ASSESSMENT: 66 year old female with: 1.History left breast cancer, no evidence of recurrence by clinical exam.   #2 patient is on Arimidex 1 mg daily overall she's tolerating it well she has no evidence of Clinical recurrence.  PLAN:  1. Continue Arimidex 1 mg daily x 5 years 2. Follow up in 6 months, with follow up labs at that time.  3. Baseline bone density and mammogram in September  All questions were answered. The patient knows to call the clinic with any problems, questions or concerns.   Drue Second, MD Medical/Oncology [facility] [phone] (beeper) [phone] (Office)  01/16/2012, 10:49 AM

## 2012-01-16 NOTE — Patient Instructions (Addendum)
Doing well Keep appointments for mammograms   I will see you back in 6 months

## 2012-01-16 NOTE — Telephone Encounter (Signed)
gve the pt her march 2014 appt calendar °

## 2012-01-22 ENCOUNTER — Encounter (INDEPENDENT_AMBULATORY_CARE_PROVIDER_SITE_OTHER): Payer: Self-pay | Admitting: General Surgery

## 2012-01-22 ENCOUNTER — Ambulatory Visit (INDEPENDENT_AMBULATORY_CARE_PROVIDER_SITE_OTHER): Payer: Medicare Other | Admitting: General Surgery

## 2012-01-22 VITALS — BP 104/56 | HR 64 | Temp 97.8°F | Resp 12 | Ht 65.0 in | Wt 215.6 lb

## 2012-01-22 DIAGNOSIS — Z853 Personal history of malignant neoplasm of breast: Secondary | ICD-10-CM

## 2012-01-22 NOTE — Progress Notes (Signed)
Operation: Left partial mastectomy and sentinel lymph node biopsy  Date:  February 28, 2011  Stage: T2N0  Hormone receptor status:  Positive  HPI:  She is here for followup of her left breast cancer. She continues to tolerate the Arimidex well.  She denies any masses or new pain in her breasts. No adenopathy.  PE: Left breast-superior scar is well healed and softer. Radiation changes to the skin are less obvious.  No new masses.  Right breast-soft, no dominant masses or suspicious changes.  Lymph nodes-no cervical, supraclavicular, or axillary adenopathy.  Assessment:  Left breast cancer status post breast conservation therapy and radiation therapy with no clinical evidence of recurrence.  Plan:  Return visit in 3 months.

## 2012-01-22 NOTE — Patient Instructions (Signed)
Call if you feel any new lumps in your breasts. 

## 2012-02-03 ENCOUNTER — Ambulatory Visit
Admission: RE | Admit: 2012-02-03 | Discharge: 2012-02-03 | Disposition: A | Discharge status: Home / Self Care | Payer: Medicare Other | Source: Ambulatory Visit | Attending: Family | Admitting: Family

## 2012-02-03 DIAGNOSIS — C50919 Malignant neoplasm of unspecified site of unspecified female breast: Secondary | ICD-10-CM

## 2012-02-12 ENCOUNTER — Telehealth: Payer: Self-pay | Admitting: Medical Oncology

## 2012-02-12 ENCOUNTER — Other Ambulatory Visit: Payer: Self-pay | Admitting: Family

## 2012-02-12 ENCOUNTER — Other Ambulatory Visit: Payer: Self-pay | Admitting: Oncology

## 2012-02-12 NOTE — Telephone Encounter (Signed)
Patient called requesting bone density results from 02/03/2012.  Will review with MD

## 2012-04-22 ENCOUNTER — Encounter (INDEPENDENT_AMBULATORY_CARE_PROVIDER_SITE_OTHER): Payer: Self-pay | Admitting: General Surgery

## 2012-04-22 ENCOUNTER — Other Ambulatory Visit: Payer: Self-pay | Admitting: *Deleted

## 2012-04-22 ENCOUNTER — Ambulatory Visit (INDEPENDENT_AMBULATORY_CARE_PROVIDER_SITE_OTHER): Payer: Medicare Other | Admitting: General Surgery

## 2012-04-22 VITALS — BP 122/78 | HR 74 | Temp 97.6°F | Resp 18 | Ht 65.0 in | Wt 212.0 lb

## 2012-04-22 DIAGNOSIS — C50319 Malignant neoplasm of lower-inner quadrant of unspecified female breast: Secondary | ICD-10-CM

## 2012-04-22 DIAGNOSIS — Z853 Personal history of malignant neoplasm of breast: Secondary | ICD-10-CM

## 2012-04-22 MED ORDER — ANASTROZOLE 1 MG PO TABS: 1.0000 mg | ORAL_TABLET | Freq: Every day | ORAL | Status: DC

## 2012-04-22 NOTE — Progress Notes (Signed)
Operation: Left partial mastectomy and sentinel lymph node biopsy  Date:  February 28, 2011  Stage: T2N0  Hormone receptor status:  Positive  HPI:  She is here for long term followup of her left breast cancer. She continues to tolerate the Arimidex well.  She denies any masses or new pain in her breasts. No adenopathy.  01/2012 MMG is a BIRADS 2.  PE: Left breast-superior scar is well healed and softer. Radiation changes to the skin are less obvious.  No new masses.  Right breast-soft, no dominant masses or suspicious changes.  Lymph nodes-no cervical, supraclavicular, or axillary adenopathy.  Assessment:  Left breast cancer status post breast conservation therapy and radiation therapy with no clinical or radiographic evidence of recurrence.  Plan:  Return visit in 3 months.

## 2012-04-22 NOTE — Patient Instructions (Signed)
Call if you find any lumps in your breasts.

## 2012-06-24 ENCOUNTER — Telehealth: Payer: Self-pay | Admitting: Oncology

## 2012-06-24 NOTE — Telephone Encounter (Signed)
Pt called today to r/s 3/28 appt. Pt given new appt for 4/14.

## 2012-07-27 ENCOUNTER — Ambulatory Visit (INDEPENDENT_AMBULATORY_CARE_PROVIDER_SITE_OTHER): Payer: Medicare Other | Admitting: General Surgery

## 2012-08-14 ENCOUNTER — Encounter (INDEPENDENT_AMBULATORY_CARE_PROVIDER_SITE_OTHER): Payer: Medicare Other | Admitting: General Surgery

## 2012-08-14 ENCOUNTER — Other Ambulatory Visit: Payer: Medicare Other | Admitting: Lab

## 2012-08-14 ENCOUNTER — Ambulatory Visit: Payer: Medicare Other | Admitting: Oncology

## 2012-08-18 ENCOUNTER — Telehealth: Payer: Self-pay | Admitting: *Deleted

## 2012-08-18 NOTE — Telephone Encounter (Signed)
Sw pt made her aware that KK will be on pal 08/31/12. gv her appt d/t for 10/30/12 @2 :30pm.

## 2012-08-31 ENCOUNTER — Other Ambulatory Visit: Payer: Self-pay | Admitting: Lab

## 2012-08-31 ENCOUNTER — Ambulatory Visit: Payer: Self-pay | Admitting: Oncology

## 2012-09-25 ENCOUNTER — Ambulatory Visit (INDEPENDENT_AMBULATORY_CARE_PROVIDER_SITE_OTHER): Payer: Medicare Other | Admitting: General Surgery

## 2012-09-25 ENCOUNTER — Encounter (INDEPENDENT_AMBULATORY_CARE_PROVIDER_SITE_OTHER): Payer: Self-pay | Admitting: General Surgery

## 2012-09-25 VITALS — BP 130/60 | HR 69 | Temp 98.6°F | Resp 18 | Ht 65.0 in | Wt 215.8 lb

## 2012-09-25 DIAGNOSIS — Z853 Personal history of malignant neoplasm of breast: Secondary | ICD-10-CM

## 2012-09-25 NOTE — Progress Notes (Signed)
Operation: Left partial mastectomy and sentinel lymph node biopsy  Date:  February 28, 2011  Stage: T2N0  Hormone receptor status:  Positive  HPI:  She is here for another long term followup visit for her left breast cancer. She continues to tolerate the Arimidex well.  She denies any masses or new pain in her breasts. No adenopathy.  01/2012 MMG is a BIRADS 2.  PE: Left breast-superior scar is well healed and softer. Radiation changes to the skin are less obvious.  No new masses.  Right breast-soft, no dominant masses or suspicious changes.  Lymph nodes-no cervical, supraclavicular, or axillary adenopathy.  Assessment:  Left breast cancer status post breast conservation therapy and radiation therapy with no clinical or radiographic evidence of recurrence.  Plan:  Return visit in 3 months.

## 2012-09-25 NOTE — Patient Instructions (Signed)
Call if you notice anything different with your breasts.

## 2012-10-30 ENCOUNTER — Encounter: Payer: Self-pay | Admitting: Oncology

## 2012-10-30 ENCOUNTER — Telehealth: Payer: Self-pay | Admitting: Oncology

## 2012-10-30 ENCOUNTER — Ambulatory Visit: Payer: Self-pay | Admitting: Oncology

## 2012-10-30 ENCOUNTER — Other Ambulatory Visit: Payer: Self-pay | Admitting: Lab

## 2012-10-30 ENCOUNTER — Ambulatory Visit (HOSPITAL_BASED_OUTPATIENT_CLINIC_OR_DEPARTMENT_OTHER): Payer: Medicare Other | Admitting: Adult Health

## 2012-10-30 ENCOUNTER — Other Ambulatory Visit (HOSPITAL_BASED_OUTPATIENT_CLINIC_OR_DEPARTMENT_OTHER): Payer: Medicare Other | Admitting: Lab

## 2012-10-30 VITALS — BP 117/72 | HR 62 | Temp 98.2°F | Resp 18 | Ht 65.0 in | Wt 215.7 lb

## 2012-10-30 DIAGNOSIS — E559 Vitamin D deficiency, unspecified: Secondary | ICD-10-CM

## 2012-10-30 DIAGNOSIS — C50112 Malignant neoplasm of central portion of left female breast: Secondary | ICD-10-CM

## 2012-10-30 DIAGNOSIS — C50119 Malignant neoplasm of central portion of unspecified female breast: Secondary | ICD-10-CM

## 2012-10-30 LAB — COMPREHENSIVE METABOLIC PANEL (CC13)
BUN: 16.7 mg/dL (ref 7.0–26.0)
CO2: 29 mEq/L (ref 22–29)
Calcium: 10.2 mg/dL (ref 8.4–10.4)
Chloride: 102 mEq/L (ref 98–107)
Creatinine: 1 mg/dL (ref 0.6–1.1)
Glucose: 96 mg/dl (ref 70–99)

## 2012-10-30 LAB — CBC WITH DIFFERENTIAL/PLATELET
Basophils Absolute: 0.1 10*3/uL (ref 0.0–0.1)
EOS%: 1.8 % (ref 0.0–7.0)
Eosinophils Absolute: 0.1 10*3/uL (ref 0.0–0.5)
HCT: 39.8 % (ref 34.8–46.6)
HGB: 13.4 g/dL (ref 11.6–15.9)
MCH: 28.3 pg (ref 25.1–34.0)
NEUT#: 5.5 10*3/uL (ref 1.5–6.5)
NEUT%: 70.5 % (ref 38.4–76.8)
lymph#: 1.6 10*3/uL (ref 0.9–3.3)

## 2012-10-30 NOTE — Progress Notes (Signed)
  OFFICE PROGRESS NOTE  CC: Dr. Pearson Grippe Dr. Avel Peace  DIAGNOSIS: Stage II left breast cancer diagnosed 10/12. Infiltrating ductal, ER+, 0/2 lymph nodes positive.   PRIOR THERAPY: 1. Left partial mastectomy with sentinel node biopsy 02/28/11.  2. Radiation therapy to the left breast   CURRENT THERAPY: Arimidex daily (1 mg) Began Feb. 2013.   INTERVAL HISTORY: Ms. [name] is here for follow up of her left breast invasive ductal carcinoma.  She is taking the Arimidex every day. She has occasional hot flashes, but denies joint aches, dryness or any other concerns.  She is occasionally fatigued, but otherwise a 10 point ROS is neg.    ALLERGIES:  Zithromax.  Health Maintenance  Mammogram: 02/03/12 Colonoscopy: never had one, does not want one Bone Density Scan: 02/03/12 Pap Smear: s/p TAH, still has ovaries Eye Exam: 6/14 Vitamin D Level: never Lipid Panel: 6/14   PHYSICAL EXAMINATION:  BP 117/72  Pulse 62  Temp(Src) 98.2 F (36.8 C) (Oral)  Resp 18  Ht 5\' 5"  (1.651 m)  Wt 215 lb 11.2 oz (97.841 kg)  BMI 35.89 kg/m2 General: Well developed, well nourished, in no acute distress.  EENT: No ocular or oral lesions. No stomatitis.  Respiratory: Lungs are clear to auscultation bilaterally with normal respiratory movement and no accessory muscle use. Cardiac: No murmur, rub or tachycardia. No upper or lower extremity edema.  GI: Abdomen is soft, no palpable hepatosplenomegaly. No fluid wave. No tenderness. Musculoskeletal: No kyphosis, no tenderness over the spine, ribs or hips. Lymph: No cervical, infraclavicular, axillary or inguinal adenopathy. Neuro: No focal neurological deficits. Psych: Alert and oriented X 3, flat affect.  Breast: BREAST EXAM: In the supine position, with the right arm over the head, the right nipple is everted. No periareolar edema or nipple discharge. No mass in any quadrant or subareolar region. No redness of the skin. No right axillary  adenopathy. With the left arm over the head, the left nipple is everted. Skin thickening of left areola. No periareolar edema or nipple discharge. No mass in any quadrant or subareolar region.  In the 10 - 2 o'clock position, a 7 cm remote well-healed incision. Volume deficit underlying the scar. No redness of the skin. No left axillary adenopathy. A 7 cm remote well-healed incision in the left axilla.   ECOG PERFORMANCE STATUS: O   LABORATORY DATA: Lab Results  Component Value Date   WBC 7.8 10/30/2012   HGB 13.4 10/30/2012   HCT 39.8 10/30/2012   MCV 84.3 10/30/2012   PLT 234 10/30/2012   ASSESSMENT: 67 year old female with: 1.History left breast cancer, no evidence of recurrence by clinical exam.   #2 patient is on Arimidex 1 mg daily overall she's tolerating it well she has no evidence of Clinical recurrence.  PLAN:  1. Continue Arimidex 1 mg daily x 5 years 2. Follow up in 6 months, with follow up labs at that time.  3.Proceed with Mammogram when due in September  All questions were answered. The patient knows to call the clinic with any problems, questions or concerns.   I spent 25 minutes counseling the patient face to face.  The total time spent in the appointment was 30 minutes.  Cherie Ouch Lyn Hollingshead, NP Medical Oncology [facility] Phone: [phone] 10/30/2012, 2:48 PM

## 2012-10-30 NOTE — Patient Instructions (Addendum)
Doing well.  No sign of recurrence.  Continue daily Arimidex.  Please call us if you have any questions or concerns.     We will see you back in 6 months.

## 2012-10-30 NOTE — Telephone Encounter (Signed)
, °

## 2012-11-09 NOTE — Progress Notes (Signed)
ATTENDING'S ATTESTATION:  I personally reviewed patient's chart, examined patient myself, formulated the treatment plan as followed.    Patient is doing well on arimidex 1 mg daily  We discussed follow up mammograms that will be due in September.  I will see her back in 6 months   Drue Second, MD Medical/Oncology [facility] [phone] (beeper) [phone] (Office)

## 2012-12-28 ENCOUNTER — Telehealth: Payer: Self-pay | Admitting: *Deleted

## 2012-12-28 ENCOUNTER — Other Ambulatory Visit: Payer: Self-pay | Admitting: Family

## 2012-12-28 ENCOUNTER — Other Ambulatory Visit: Payer: Self-pay | Admitting: Oncology

## 2012-12-28 DIAGNOSIS — M810 Age-related osteoporosis without current pathological fracture: Secondary | ICD-10-CM

## 2012-12-28 DIAGNOSIS — C50919 Malignant neoplasm of unspecified site of unspecified female breast: Secondary | ICD-10-CM

## 2012-12-28 NOTE — Telephone Encounter (Signed)
Pt called [facility]OVM states " I'm calling because the Breast Cancer place on [address] [facility])  is requesting I get a bone density test because I'm trying to get a mammogram in September. If Dr. Welton Flakes or Mardella Layman could send that order to them that to them so I can get that done. Message forwarded to provider. Next f/u 04/30/13 with MD

## 2012-12-28 NOTE — Telephone Encounter (Signed)
pof sent to schedulers and order done

## 2012-12-28 NOTE — Telephone Encounter (Signed)
Called pt advised order has been sent for bone density test. Pt verbalized understanding. No further concerns.

## 2012-12-30 ENCOUNTER — Telehealth: Payer: Self-pay | Admitting: *Deleted

## 2012-12-30 ENCOUNTER — Other Ambulatory Visit: Payer: Self-pay | Admitting: Internal Medicine

## 2012-12-30 DIAGNOSIS — Z853 Personal history of malignant neoplasm of breast: Secondary | ICD-10-CM

## 2012-12-30 NOTE — Telephone Encounter (Signed)
sw pt gv appt for a bone dex 02/03/14@10am . Pt is aware that i will mail a letter/avs as well...td

## 2013-02-04 ENCOUNTER — Ambulatory Visit
Admission: RE | Admit: 2013-02-04 | Discharge: 2013-02-04 | Disposition: A | Discharge status: Home / Self Care | Payer: Medicare Other | Source: Ambulatory Visit | Attending: Internal Medicine | Admitting: Internal Medicine

## 2013-02-04 DIAGNOSIS — Z853 Personal history of malignant neoplasm of breast: Secondary | ICD-10-CM

## 2013-03-05 ENCOUNTER — Encounter (INDEPENDENT_AMBULATORY_CARE_PROVIDER_SITE_OTHER): Payer: Self-pay | Admitting: General Surgery

## 2013-03-05 ENCOUNTER — Ambulatory Visit (INDEPENDENT_AMBULATORY_CARE_PROVIDER_SITE_OTHER): Payer: Medicare Other | Admitting: General Surgery

## 2013-03-05 VITALS — BP 124/70 | HR 72 | Temp 97.4°F | Resp 14 | Ht 65.0 in | Wt 218.2 lb

## 2013-03-05 DIAGNOSIS — Z853 Personal history of malignant neoplasm of breast: Secondary | ICD-10-CM

## 2013-03-05 NOTE — Progress Notes (Signed)
Operation: Left partial mastectomy and sentinel lymph node biopsy  Date:  February 28, 2011  Stage: T2N0  Hormone receptor status:  Positive  HPI:  She is here for another long term followup visit for her left breast cancer. She continues to tolerate the Arimidex well.  She denies any masses or new pain in her breasts. No adenopathy.  01/2013 MMG is a BIRADS 2.  PE: Left breast-superior scar is well healed and softer. Radiation changes to the skin are less obvious.  No new masses.  Right breast-soft, no dominant masses or suspicious changes.  Lymph nodes-no cervical, supraclavicular, or axillary adenopathy.  Assessment:  Left breast cancer status post breast conservation therapy and radiation therapy with no clinical or radiographic evidence of recurrence.  Plan:  Return visit in 8 months.

## 2013-03-05 NOTE — Patient Instructions (Signed)
Call if you notice any changes in your breasts. 

## 2013-04-14 ENCOUNTER — Encounter (HOSPITAL_COMMUNITY): Payer: Self-pay | Admitting: Emergency Medicine

## 2013-04-14 ENCOUNTER — Emergency Department (HOSPITAL_COMMUNITY): Payer: Medicare Other

## 2013-04-14 ENCOUNTER — Emergency Department (HOSPITAL_COMMUNITY)
Admission: EM | Admit: 2013-04-14 | Discharge: 2013-04-14 | Disposition: A | Discharge status: Home / Self Care | Payer: Medicare Other | Attending: Emergency Medicine | Admitting: Emergency Medicine

## 2013-04-14 DIAGNOSIS — Y92009 Unspecified place in unspecified non-institutional (private) residence as the place of occurrence of the external cause: Secondary | ICD-10-CM | POA: Insufficient documentation

## 2013-04-14 DIAGNOSIS — E785 Hyperlipidemia, unspecified: Secondary | ICD-10-CM | POA: Insufficient documentation

## 2013-04-14 DIAGNOSIS — S79919A Unspecified injury of unspecified hip, initial encounter: Secondary | ICD-10-CM | POA: Insufficient documentation

## 2013-04-14 DIAGNOSIS — R296 Repeated falls: Secondary | ICD-10-CM | POA: Insufficient documentation

## 2013-04-14 DIAGNOSIS — W19XXXA Unspecified fall, initial encounter: Secondary | ICD-10-CM

## 2013-04-14 DIAGNOSIS — Y9301 Activity, walking, marching and hiking: Secondary | ICD-10-CM | POA: Insufficient documentation

## 2013-04-14 DIAGNOSIS — IMO0002 Reserved for concepts with insufficient information to code with codable children: Secondary | ICD-10-CM | POA: Insufficient documentation

## 2013-04-14 DIAGNOSIS — I10 Essential (primary) hypertension: Secondary | ICD-10-CM | POA: Insufficient documentation

## 2013-04-14 DIAGNOSIS — H543 Unqualified visual loss, both eyes: Secondary | ICD-10-CM | POA: Insufficient documentation

## 2013-04-14 DIAGNOSIS — Z79899 Other long term (current) drug therapy: Secondary | ICD-10-CM | POA: Insufficient documentation

## 2013-04-14 DIAGNOSIS — R42 Dizziness and giddiness: Secondary | ICD-10-CM | POA: Insufficient documentation

## 2013-04-14 DIAGNOSIS — S0003XA Contusion of scalp, initial encounter: Secondary | ICD-10-CM | POA: Insufficient documentation

## 2013-04-14 DIAGNOSIS — Z853 Personal history of malignant neoplasm of breast: Secondary | ICD-10-CM | POA: Insufficient documentation

## 2013-04-14 LAB — URINE MICROSCOPIC-ADD ON

## 2013-04-14 LAB — CBC WITH DIFFERENTIAL/PLATELET
Eosinophils Absolute: 0.3 10*3/uL (ref 0.0–0.7)
HCT: 39.5 % (ref 36.0–46.0)
Hemoglobin: 13.4 g/dL (ref 12.0–15.0)
Lymphocytes Relative: 19 % (ref 12–46)
Lymphs Abs: 1.7 10*3/uL (ref 0.7–4.0)
MCH: 28.8 pg (ref 26.0–34.0)
MCHC: 33.9 g/dL (ref 30.0–36.0)
MCV: 84.9 fL (ref 78.0–100.0)
Monocytes Absolute: 0.5 10*3/uL (ref 0.1–1.0)
Monocytes Relative: 5 % (ref 3–12)
Neutrophils Relative %: 71 % (ref 43–77)
RBC: 4.65 MIL/uL (ref 3.87–5.11)

## 2013-04-14 LAB — COMPREHENSIVE METABOLIC PANEL
Alkaline Phosphatase: 82 U/L (ref 39–117)
BUN: 15 mg/dL (ref 6–23)
Chloride: 101 mEq/L (ref 96–112)
Creatinine, Ser: 0.81 mg/dL (ref 0.50–1.10)
GFR calc Af Amer: 84 mL/min — ABNORMAL LOW (ref >90)
Glucose, Bld: 108 mg/dL — ABNORMAL HIGH (ref 70–99)
Potassium: 3.5 mEq/L (ref 3.5–5.1)
Sodium: 138 mEq/L (ref 135–145)
Total Bilirubin: 0.6 mg/dL (ref 0.3–1.2)
Total Protein: 7 g/dL (ref 6.0–8.3)

## 2013-04-14 LAB — URINALYSIS, ROUTINE W REFLEX MICROSCOPIC
Ketones, ur: NEGATIVE mg/dL
Leukocytes, UA: NEGATIVE
Nitrite: NEGATIVE
Protein, ur: NEGATIVE mg/dL

## 2013-04-14 LAB — TROPONIN I: Troponin I: <0.3 ng/mL (ref <0.30)

## 2013-04-14 MED ORDER — SODIUM CHLORIDE 0.9 % IV SOLN
INTRAVENOUS | Status: DC
  Administered 2013-04-14: 12:00:00 via INTRAVENOUS

## 2013-04-14 NOTE — ED Provider Notes (Signed)
CSN: [id number]     Arrival date & time 04/14/13  0944 History   First MD Initiated Contact with Patient 04/14/13 1018     Chief Complaint  Patient presents with  . Fall  . Hip Pain   (Consider location/radiation/quality/duration/timing/severity/associated sxs/prior Treatment) Patient is a 67 y.o. female presenting with fall and hip pain. The history is provided by the patient.  Fall  Hip Pain   patient here complaining of mid lower lumbar spinal pain with bilateral hip pain after a fall at home. States that she is unsure how she fell but since that time has had pain characterized as sharp and worse with ambulation. She also noted a hematoma to her scalp from the fall last week. There was no loss of consciousness.  No vomiting. She's had dizziness that is worse when she stands up. Denies any chest pain or chest pressure. No dyspnea or diaphoresis. Symptoms have been persistent. She is able to ambulate. Denies any shortening or rotation of her lower extremity. No treatment used prior to arrival. Denies any black or bloody stools.   Past Medical History  Diagnosis Date  . Blind   . Hypertension   . Hyperlipidemia   . Glaucoma   . Splenic artery aneurysm     8 mm  . Cancer     Left Breast   Past Surgical History  Procedure Laterality Date  . Tonsillectomy  2006  . Cesarean section      twice  . Abdominal hysterectomy    . Eye surgery      multiple  . Breast surgery  02/28/11    Left PM and SLNBx   Family History  Problem Relation Age of Onset  . Blindness    . Breast cancer Maternal Aunt   . Cancer Paternal Grandmother     unknown   History  Substance Use Topics  . Smoking status: Never Smoker   . Smokeless tobacco: Never Used  . Alcohol Use: No   OB History   Grav Para Term Preterm Abortions TAB SAB Ect Mult Living                 Review of Systems  All other systems reviewed and are negative.    Allergies  Zithromax  Home Medications   Current  Outpatient Rx  Name  Route  Sig  Dispense  Refill  . acetaminophen (TYLENOL) 500 MG tablet   Oral   Take 1,000 mg by mouth every 6 (six) hours as needed for moderate pain.         Marland Kitchen anastrozole (ARIMIDEX) 1 MG tablet   Oral   Take 1 tablet (1 mg total) by mouth daily.   90 tablet   3   . Calcium Carbonate-Vitamin D (CALTRATE 600+D PO)   Oral   Take 1 tablet by mouth every morning.          . latanoprost (XALATAN) 0.005 % ophthalmic solution   Both Eyes   Place 1 drop into both eyes 2 (two) times daily.          Marland Kitchen lisinopril-hydrochlorothiazide (PRINZIDE,ZESTORETIC) 20-12.5 MG per tablet   Oral   Take 1 tablet by mouth every morning.          . lovastatin (MEVACOR) 20 MG tablet   Oral   Take 20 mg by mouth every morning.          . metoprolol (LOPRESSOR) 50 MG tablet   Oral   Take 50 mg  by mouth 2 (two) times daily.          . timolol (TIMOPTIC) 0.5 % ophthalmic solution   Both Eyes   Place 1 drop into both eyes every morning.           BP 130/60  Pulse 67  Temp(Src) 98.4 F (36.9 C) (Oral)  Resp 18  SpO2 96% Physical Exam  Nursing note and vitals reviewed. Constitutional: She is oriented to person, place, and time. She appears well-developed and well-nourished.  Non-toxic appearance. No distress.  HENT:  Head: Normocephalic. Head is with contusion.    Eyes: Conjunctivae, EOM and lids are normal. Pupils are equal, round, and reactive to light.  Neck: Normal range of motion. Neck supple. No spinous process tenderness and no muscular tenderness present. No tracheal deviation present. No mass present.  Cardiovascular: Normal rate, regular rhythm and normal heart sounds.  Exam reveals no gallop.   No murmur heard. Pulmonary/Chest: Effort normal and breath sounds normal. No stridor. No respiratory distress. She has no decreased breath sounds. She has no wheezes. She has no rhonchi. She has no rales.  Abdominal: Soft. Normal appearance and bowel sounds  are normal. She exhibits no distension. There is no tenderness. There is no rebound and no CVA tenderness.  Musculoskeletal: Normal range of motion. She exhibits no edema and no tenderness.       Back:  Full range of motion at both hips  Neurological: She is alert and oriented to person, place, and time. She has normal strength. No cranial nerve deficit or sensory deficit. GCS eye subscore is 4. GCS verbal subscore is 5. GCS motor subscore is 6.  Skin: Skin is warm and dry. No abrasion and no rash noted.  Psychiatric: She has a normal mood and affect. Her speech is normal and behavior is normal.    ED Course  Procedures (including critical care time) Labs Review Labs Reviewed  TROPONIN I  CBC WITH DIFFERENTIAL  COMPREHENSIVE METABOLIC PANEL  URINALYSIS, ROUTINE W REFLEX MICROSCOPIC   Imaging Review No results found.  EKG Interpretation    Date/Time:  Wednesday April 14 2013 11:22:49 EST Ventricular Rate:  63 PR Interval:  195 QRS Duration: 83 QT Interval:  395 QTC Calculation: 404 R Axis:   39 Text Interpretation:  Sinus rhythm No significant change since last tracing Confirmed by Halo Shevlin  MD, Eutimio Gharibian ([id number]) on 04/14/2013 12:59:24 PM            MDM  No diagnosis found. Patient's x-rays and labs reviewed and no other acute findings noted. Do not think that this represents PE or ACS. She is neurologically intact and will followup with her Dr.    Toy Baker, MD 04/14/13 1300

## 2013-04-14 NOTE — ED Notes (Signed)
Pt reports fall last Wednesday, pt does not know why she fell, but pt fell flat on her back, unsure LOC. Pt reports since then she has bil hip pain, dizziness, and nose bleeds. Pain at present 8/10. Pain upon ambulation, pt able to ambulate independently. Pt reports head pain and knot on right side of head.

## 2013-04-14 NOTE — ED Notes (Signed)
She tells me she fell "straight backward onto my back" last Thurs. "for no reason", while walking on her asphalt driveway.  She states she has had a few self-limiting nosebleeds since then, L > R.  She also c/o "bump" on right occipital area and pain at low back and right hip areas. She is oriented x 4 with clear speech.  Her pupils are unequal, and she states she has had multiple "eye surgeries".  She is not sure if she had l.o.c. At time of fall or not, but recalls her son assisting her to her feet just after the fall, and she was able to ambulate back into her house.  CMS intact all extremities.

## 2013-04-14 NOTE — ED Notes (Signed)
She is in x-ray--will obtain las upon her return.

## 2013-04-16 LAB — URINE CULTURE

## 2013-04-18 ENCOUNTER — Telehealth (HOSPITAL_COMMUNITY): Payer: Self-pay | Admitting: Emergency Medicine

## 2013-04-18 NOTE — ED Notes (Signed)
Post ED Visit - Positive Culture Follow-up  Culture report reviewed by antimicrobial stewardship pharmacist: []  Wes Dulaney, Pharm.D., BCPS []  Celedonio Miyamoto, Pharm.D., BCPS []  Georgina Pillion, Pharm.D., BCPS []  [place], [address].D., BCPS, AAHIVP [x]  Estella Husk, Pharm.D., BCPS, AAHIVP  Positive urine culture Per Marlon Pel PA-C, no treatment recommended and no further patient follow-up is required at this time.  Kylie A Holland 04/18/2013, 12:31 PM

## 2013-04-22 ENCOUNTER — Other Ambulatory Visit: Payer: Self-pay | Admitting: *Deleted

## 2013-04-22 DIAGNOSIS — C50112 Malignant neoplasm of central portion of left female breast: Secondary | ICD-10-CM

## 2013-04-22 MED ORDER — ANASTROZOLE 1 MG PO TABS: 1.0000 mg | ORAL_TABLET | Freq: Every day | ORAL | Status: DC

## 2013-04-30 ENCOUNTER — Ambulatory Visit (HOSPITAL_BASED_OUTPATIENT_CLINIC_OR_DEPARTMENT_OTHER): Payer: Medicare Other | Admitting: Oncology

## 2013-04-30 ENCOUNTER — Telehealth: Payer: Self-pay | Admitting: Oncology

## 2013-04-30 ENCOUNTER — Other Ambulatory Visit (HOSPITAL_BASED_OUTPATIENT_CLINIC_OR_DEPARTMENT_OTHER): Payer: Medicare Other

## 2013-04-30 ENCOUNTER — Encounter: Payer: Self-pay | Admitting: Oncology

## 2013-04-30 VITALS — BP 120/68 | HR 65 | Temp 98.6°F | Resp 18 | Ht 65.0 in | Wt 215.3 lb

## 2013-04-30 DIAGNOSIS — C50112 Malignant neoplasm of central portion of left female breast: Secondary | ICD-10-CM

## 2013-04-30 DIAGNOSIS — C50919 Malignant neoplasm of unspecified site of unspecified female breast: Secondary | ICD-10-CM

## 2013-04-30 DIAGNOSIS — E559 Vitamin D deficiency, unspecified: Secondary | ICD-10-CM

## 2013-04-30 DIAGNOSIS — Z170 Estrogen receptor positive status [ER+]: Secondary | ICD-10-CM

## 2013-04-30 LAB — CBC WITH DIFFERENTIAL/PLATELET
BASO%: 1 % (ref 0.0–2.0)
Eosinophils Absolute: 0.4 10*3/uL (ref 0.0–0.5)
HGB: 12.5 g/dL (ref 11.6–15.9)
LYMPH%: 16.5 % (ref 14.0–49.7)
MCHC: 33.5 g/dL (ref 31.5–36.0)
MONO#: 0.4 10*3/uL (ref 0.1–0.9)
RBC: 4.4 10*6/uL (ref 3.70–5.45)
RDW: 13.9 % (ref 11.2–14.5)
WBC: 8.6 10*3/uL (ref 3.9–10.3)
lymph#: 1.4 10*3/uL (ref 0.9–3.3)

## 2013-04-30 LAB — COMPREHENSIVE METABOLIC PANEL (CC13)
ALT: 18 U/L (ref 0–55)
AST: 18 U/L (ref 5–34)
Anion Gap: 11 mEq/L (ref 3–11)
BUN: 17.1 mg/dL (ref 7.0–26.0)
Calcium: 9.9 mg/dL (ref 8.4–10.4)
Chloride: 103 mEq/L (ref 98–109)
Creatinine: 0.9 mg/dL (ref 0.6–1.1)
Potassium: 3.8 mEq/L (ref 3.5–5.1)
Sodium: 143 mEq/L (ref 136–145)
Total Protein: 6.8 g/dL (ref 6.4–8.3)

## 2013-04-30 MED ORDER — ANASTROZOLE 1 MG PO TABS: 1.0000 mg | ORAL_TABLET | Freq: Every day | ORAL | Status: DC

## 2013-04-30 NOTE — Telephone Encounter (Signed)
, °

## 2013-05-01 LAB — VITAMIN D 25 HYDROXY (VIT D DEFICIENCY, FRACTURES): Vit D, 25-Hydroxy: 35 ng/mL (ref 30–89)

## 2013-05-07 NOTE — Progress Notes (Signed)
  OFFICE PROGRESS NOTE  CC: Dr. Pearson Grippe Dr. Avel Peace  DIAGNOSIS: Stage II left breast cancer diagnosed 10/12. Infiltrating ductal, ER+, 0/2 lymph nodes positive.   PRIOR THERAPY: 1. Left partial mastectomy with sentinel node biopsy 02/28/11.  2. Radiation therapy to the left breast   CURRENT THERAPY: Arimidex daily (1 mg) Began Feb. 2013.   INTERVAL HISTORY: Ms. [name] is here for follow up of her left breast invasive ductal carcinoma.  She is taking the Arimidex every day. She has occasional hot flashes, but denies joint aches, dryness or any other concerns.  She is occasionally fatigued, but otherwise a 10 point ROS is neg.    ALLERGIES:  Zithromax.  Health Maintenance  Mammogram: 02/03/12 Colonoscopy: never had one, does not want one Bone Density Scan: 02/03/12 Pap Smear: s/p TAH, still has ovaries Eye Exam: 6/14 Vitamin D Level: never Lipid Panel: 6/14   PHYSICAL EXAMINATION:  BP 120/68  Pulse 65  Temp(Src) 98.6 F (37 C) (Oral)  Resp 18  Ht 5\' 5"  (1.651 m)  Wt 215 lb 4.8 oz (97.659 kg)  BMI 35.83 kg/m2 General: Well developed, well nourished, in no acute distress.  EENT: No ocular or oral lesions. No stomatitis.  Respiratory: Lungs are clear to auscultation bilaterally with normal respiratory movement and no accessory muscle use. Cardiac: No murmur, rub or tachycardia. No upper or lower extremity edema.  GI: Abdomen is soft, no palpable hepatosplenomegaly. No fluid wave. No tenderness. Musculoskeletal: No kyphosis, no tenderness over the spine, ribs or hips. Lymph: No cervical, infraclavicular, axillary or inguinal adenopathy. Neuro: No focal neurological deficits. Psych: Alert and oriented X 3, flat affect.  Breast: BREAST EXAM: In the supine position, with the right arm over the head, the right nipple is everted. No periareolar edema or nipple discharge. No mass in any quadrant or subareolar region. No redness of the skin. No right axillary adenopathy.  With the left arm over the head, the left nipple is everted. Skin thickening of left areola. No periareolar edema or nipple discharge. No mass in any quadrant or subareolar region.  In the 10 - 2 o'clock position, a 7 cm remote well-healed incision. Volume deficit underlying the scar. No redness of the skin. No left axillary adenopathy. A 7 cm remote well-healed incision in the left axilla.   ECOG PERFORMANCE STATUS: O   LABORATORY DATA: Lab Results  Component Value Date   WBC 8.6 04/30/2013   HGB 12.5 04/30/2013   HCT 37.4 04/30/2013   MCV 85.0 04/30/2013   PLT 272 04/30/2013   ASSESSMENT: 67 year old female with: 1.History left breast cancer, no evidence of recurrence by clinical exam.   #2 patient is on Arimidex 1 mg daily overall she's tolerating it well she has no evidence of Clinical recurrence.  PLAN:  1. Continue Arimidex 1 mg daily x 5 years 2. Follow up in 6 months, with follow up labs at that time.  3.Proceed with Mammogram when due in September  All questions were answered. The patient knows to call the clinic with any problems, questions or concerns.   I spent 25 minutes counseling the patient face to face.  The total time spent in the appointment was 30 minutes.  Drue Second, MD Medical/Oncology [facility] [phone] (beeper) [phone] (Office)

## 2013-10-29 ENCOUNTER — Other Ambulatory Visit: Payer: Self-pay | Admitting: Internal Medicine

## 2013-10-29 ENCOUNTER — Telehealth: Payer: Self-pay | Admitting: Hematology and Oncology

## 2013-10-29 DIAGNOSIS — Z853 Personal history of malignant neoplasm of breast: Secondary | ICD-10-CM

## 2013-10-29 DIAGNOSIS — Z9889 Other specified postprocedural states: Secondary | ICD-10-CM

## 2013-10-29 NOTE — Telephone Encounter (Signed)
, °

## 2013-11-05 ENCOUNTER — Other Ambulatory Visit: Payer: Medicare Other

## 2013-11-05 ENCOUNTER — Ambulatory Visit: Payer: Medicare Other | Admitting: Oncology

## 2013-11-30 ENCOUNTER — Other Ambulatory Visit: Payer: Self-pay | Admitting: *Deleted

## 2013-11-30 DIAGNOSIS — C50112 Malignant neoplasm of central portion of left female breast: Secondary | ICD-10-CM

## 2013-11-30 MED ORDER — ANASTROZOLE 1 MG PO TABS: 1.0000 mg | ORAL_TABLET | Freq: Every day | ORAL | Status: DC

## 2014-01-10 ENCOUNTER — Ambulatory Visit (INDEPENDENT_AMBULATORY_CARE_PROVIDER_SITE_OTHER): Payer: Medicare Other | Admitting: General Surgery

## 2014-01-10 VITALS — BP 120/70 | HR 66 | Temp 98.0°F | Ht 65.0 in | Wt 217.0 lb

## 2014-01-10 DIAGNOSIS — C50919 Malignant neoplasm of unspecified site of unspecified female breast: Secondary | ICD-10-CM

## 2014-01-10 DIAGNOSIS — C50912 Malignant neoplasm of unspecified site of left female breast: Secondary | ICD-10-CM

## 2014-01-10 NOTE — Patient Instructions (Signed)
Call if you find any new lumps in your breasts or chest wall. 

## 2014-01-10 NOTE — Progress Notes (Signed)
Operation: Left partial mastectomy and sentinel lymph node biopsy  Date:  February 28, 2011  Stage: T2N0  Hormone receptor status:  Positive  HPI:  She is here for another long term followup visit for her left breast cancer. She continues to tolerate the Arimidex well.  She denies any masses or new pain in her breasts. No adenopathy.  Mammogram is due to be done next month. She is seeing Dr. Lindi Adie next month.  PE: Left breast-superior scar is well healed and softer. Radiation changes to the skin are less obvious.  No new masses.  Right breast-soft, no dominant masses or suspicious changes.  Lymph nodes-no cervical, supraclavicular, or axillary adenopathy.  Assessment:  Left breast cancer status post breast conservation therapy and radiation therapy with no clinical or radiographic evidence of recurrence.  Plan:  Return visit in 6 months.  Can alternate with Dr. Lindi Adie so she can be seen twice a year.

## 2014-02-02 ENCOUNTER — Ambulatory Visit: Payer: Medicare Other | Admitting: Hematology and Oncology

## 2014-02-02 ENCOUNTER — Other Ambulatory Visit: Payer: Medicare Other

## 2014-02-03 ENCOUNTER — Ambulatory Visit
Admission: RE | Admit: 2014-02-03 | Discharge: 2014-02-03 | Disposition: A | Discharge status: Home / Self Care | Payer: Medicare Other | Source: Ambulatory Visit | Attending: Oncology | Admitting: Oncology

## 2014-02-03 ENCOUNTER — Other Ambulatory Visit: Payer: Self-pay

## 2014-02-03 ENCOUNTER — Ambulatory Visit
Admission: RE | Admit: 2014-02-03 | Discharge: 2014-02-03 | Disposition: A | Discharge status: Home / Self Care | Payer: Medicare Other | Source: Ambulatory Visit | Attending: Internal Medicine | Admitting: Internal Medicine

## 2014-02-03 DIAGNOSIS — C50119 Malignant neoplasm of central portion of unspecified female breast: Secondary | ICD-10-CM

## 2014-02-03 DIAGNOSIS — Z9889 Other specified postprocedural states: Secondary | ICD-10-CM

## 2014-02-03 DIAGNOSIS — Z853 Personal history of malignant neoplasm of breast: Secondary | ICD-10-CM

## 2014-02-03 DIAGNOSIS — C50919 Malignant neoplasm of unspecified site of unspecified female breast: Secondary | ICD-10-CM

## 2014-02-03 DIAGNOSIS — M810 Age-related osteoporosis without current pathological fracture: Secondary | ICD-10-CM

## 2014-02-04 ENCOUNTER — Encounter: Payer: Self-pay | Admitting: Hematology and Oncology

## 2014-02-04 ENCOUNTER — Ambulatory Visit (HOSPITAL_BASED_OUTPATIENT_CLINIC_OR_DEPARTMENT_OTHER): Payer: Medicare Other | Admitting: Hematology and Oncology

## 2014-02-04 ENCOUNTER — Other Ambulatory Visit (HOSPITAL_BASED_OUTPATIENT_CLINIC_OR_DEPARTMENT_OTHER): Payer: Medicare Other

## 2014-02-04 VITALS — BP 114/59 | HR 65 | Temp 98.4°F | Resp 18 | Ht 65.0 in | Wt 217.5 lb

## 2014-02-04 DIAGNOSIS — C50119 Malignant neoplasm of central portion of unspecified female breast: Secondary | ICD-10-CM

## 2014-02-04 DIAGNOSIS — C50919 Malignant neoplasm of unspecified site of unspecified female breast: Secondary | ICD-10-CM

## 2014-02-04 DIAGNOSIS — M949 Disorder of cartilage, unspecified: Secondary | ICD-10-CM

## 2014-02-04 DIAGNOSIS — C50112 Malignant neoplasm of central portion of left female breast: Secondary | ICD-10-CM

## 2014-02-04 DIAGNOSIS — M899 Disorder of bone, unspecified: Secondary | ICD-10-CM

## 2014-02-04 DIAGNOSIS — M8580 Other specified disorders of bone density and structure, unspecified site: Secondary | ICD-10-CM | POA: Insufficient documentation

## 2014-02-04 LAB — CBC WITH DIFFERENTIAL/PLATELET
BASO%: 1.2 % (ref 0.0–2.0)
Basophils Absolute: 0.1 10*3/uL (ref 0.0–0.1)
EOS%: 7.8 % — ABNORMAL HIGH (ref 0.0–7.0)
Eosinophils Absolute: 0.7 10*3/uL — ABNORMAL HIGH (ref 0.0–0.5)
HEMATOCRIT: 39.2 % (ref 34.8–46.6)
HGB: 12.8 g/dL (ref 11.6–15.9)
LYMPH#: 1.5 10*3/uL (ref 0.9–3.3)
LYMPH%: 16.6 % (ref 14.0–49.7)
MCH: 28.2 pg (ref 25.1–34.0)
MCHC: 32.6 g/dL (ref 31.5–36.0)
MCV: 86.6 fL (ref 79.5–101.0)
MONO#: 0.5 10*3/uL (ref 0.1–0.9)
MONO%: 5.7 % (ref 0.0–14.0)
NEUT%: 68.7 % (ref 38.4–76.8)
NEUTROS ABS: 6 10*3/uL (ref 1.5–6.5)
Platelets: 252 10*3/uL (ref 145–400)
RBC: 4.53 10*6/uL (ref 3.70–5.45)
RDW: 13.8 % (ref 11.2–14.5)
WBC: 8.7 10*3/uL (ref 3.9–10.3)

## 2014-02-04 LAB — COMPREHENSIVE METABOLIC PANEL (CC13)
ALBUMIN: 3.5 g/dL (ref 3.5–5.0)
ALT: 11 U/L (ref 0–55)
AST: 11 U/L (ref 5–34)
Alkaline Phosphatase: 68 U/L (ref 40–150)
Anion Gap: 10 mEq/L (ref 3–11)
BUN: 21.6 mg/dL (ref 7.0–26.0)
CHLORIDE: 104 meq/L (ref 98–109)
CO2: 28 mEq/L (ref 22–29)
Calcium: 9.7 mg/dL (ref 8.4–10.4)
Creatinine: 0.9 mg/dL (ref 0.6–1.1)
Glucose: 124 mg/dl (ref 70–140)
POTASSIUM: 3.6 meq/L (ref 3.5–5.1)
SODIUM: 142 meq/L (ref 136–145)
TOTAL PROTEIN: 6.8 g/dL (ref 6.4–8.3)
Total Bilirubin: 0.7 mg/dL (ref 0.20–1.20)

## 2014-02-04 MED ORDER — IBANDRONATE SODIUM 150 MG PO TABS: 150.0000 mg | ORAL_TABLET | ORAL | Status: DC

## 2014-02-04 NOTE — Assessment & Plan Note (Signed)
Left side T2 NX Mx  ER 100%  PR 88% Ki67 14% Her 2 Neu 0.97 Patient had a recent mammogram which was normal. Today's breast exam was also normal. She is tolerating antiestrogen therapy very well except for worsening osteopenia. I prescribed her Boniva along with calcium and vitamin D.  Discussed the importance of physical exercise in decreasing the likelihood of breast cancer recurrence. Recommended 30 mins daily 6 days a week of either brisk walking or cycling or swimming. Encouraged patient to eat more fruits and vegetables and decrease red meat.   Return to clinic in one year for followup after another set of mammograms.

## 2014-02-04 NOTE — Assessment & Plan Note (Signed)
Osteopenia with a T score of -2.2 in comparison to 2 years ago it was -1.6. I am worried about her risk of progression to osteoporosis. I recommended that she take calcium and vitamin D and along with that to take Boniva once a month. I provided her instructions that she should take it with a large glass of water and should not lie down after taking it. He should not take it with any other tablets with it. I sent it to her pharmacy

## 2014-02-04 NOTE — Progress Notes (Signed)
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   02/01/2011 Initial Diagnosis 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  INTERVAL HISTORY: [name] is a 68 year old Caucasian lady with above-mentioned history of left breast cancer treated with lumpectomy and radiation currently on antiestrogen therapy with Arimidex that started in thousand 41. She reports no problems taking the Arimidex. She had recent mammograms which were normal. She'll set of bone density test done recently which showed worsening osteopenia.  REVIEW OF SYSTEMS:   Constitutional: Denies fevers, chills or abnormal weight loss Eyes: Denies blurriness of vision Ears, nose, mouth, throat, and face: Denies mucositis or sore throat Respiratory: Denies cough, dyspnea or wheezes Cardiovascular: Denies palpitation, chest discomfort  or lower extremity swelling Gastrointestinal:  Denies nausea, heartburn or change in bowel habits Skin: Denies abnormal skin rashes Lymphatics: Denies new lymphadenopathy or easy bruising Neurological:Denies numbness, tingling or new weaknesses Behavioral/Psych: Mood is stable, no new changes  Breast:  denies any pain or lumps or nodules in either breasts All other systems were reviewed with the patient and are negative.  I have reviewed the past medical history, past surgical history, social history and family history with the patient and they are unchanged from previous note.  ALLERGIES:  is allergic to zithromax.  MEDICATIONS:  Current Outpatient Prescriptions  Medication Sig Dispense Refill  . anastrozole (ARIMIDEX) 1 MG tablet Take 1 tablet (1 mg total) by mouth daily.  90 tablet  0  . Calcium Carbonate-Vitamin D (CALTRATE 600+D PO) Take 1 tablet by mouth every morning.       . latanoprost (XALATAN) 0.005 % ophthalmic solution Place 1 drop into both eyes 2 (two) times daily.       Marland Kitchen lisinopril-hydrochlorothiazide (PRINZIDE,ZESTORETIC) 20-12.5 MG per tablet Take 1 tablet by mouth every morning.       . lovastatin (MEVACOR) 20 MG tablet Take 20 mg by mouth every morning.       . metoprolol (LOPRESSOR) 50 MG tablet Take 50 mg by mouth 2 (two) times daily.       . timolol (TIMOPTIC) 0.5 % ophthalmic solution Place 1 drop into both eyes every morning.       Marland Kitchen acetaminophen (TYLENOL) 500 MG tablet Take 1,000 mg by mouth every 6 (six) hours as needed for moderate pain.      Marland Kitchen ibandronate (BONIVA) 150 MG tablet Take 1 tablet (150  mg total) by mouth every 30 (thirty) days. Take in the morning with a full glass of water, on an empty stomach, and do not take anything else by mouth or lie down for the next 30 min.  3 tablet  3   No current facility-administered medications for this visit.    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 OROPHARYNX:no exudate, no erythema and lips, buccal mucosa, and tongue normal  NECK: supple, thyroid normal size, non-tender, without nodularity LYMPH:  no palpable lymphadenopathy in the cervical, axillary or inguinal LUNGS: clear to auscultation and percussion with normal breathing effort HEART: regular rate & rhythm and no murmurs and no lower extremity edema ABDOMEN:abdomen soft, non-tender and normal bowel sounds Musculoskeletal:no cyanosis of digits and no clubbing  NEURO: alert & oriented x 3 with fluent speech, no focal motor/sensory deficits BREAST: No palpable masses or nodules in either right or left breasts. No palpable axillary supraclavicular or infraclavicular adenopathy no breast tenderness or nipple discharge.   LABORATORY DATA:  I have reviewed the data as listed   Chemistry      Component Value Date/Time   NA 142 02/04/2014 1016   NA 138 04/14/2013 1130   K 3.6 02/04/2014 1016   K 3.5 04/14/2013 1130   CL 101 04/14/2013 1130   CL 102 10/30/2012 1421   CO2 28 02/04/2014 1016   CO2 24 04/14/2013 1130   BUN 21.6 02/04/2014 1016   BUN 15 04/14/2013 1130   CREATININE 0.9 02/04/2014 1016   CREATININE 0.81 04/14/2013 1130      Component Value Date/Time   CALCIUM 9.7 02/04/2014 1016   CALCIUM 9.7 04/14/2013 1130   ALKPHOS 68 02/04/2014 1016   ALKPHOS 82 04/14/2013 1130   AST 11 02/04/2014 1016   AST 17 04/14/2013 1130   ALT 11 02/04/2014 1016   ALT 16 04/14/2013 1130   BILITOT 0.70 02/04/2014 1016   BILITOT 0.6 04/14/2013 1130       Lab Results  Component Value Date   WBC 8.7 02/04/2014   HGB 12.8 02/04/2014   HCT 39.2 02/04/2014   MCV 86.6 02/04/2014   PLT 252 02/04/2014   NEUTROABS  6.0 02/04/2014     RADIOGRAPHIC STUDIES: I have personally reviewed the radiology reports and agreed with their findings. Dg Bone Density  02/03/2014   CLINICAL DATA:  Postmenopausal.  Patient on Anastrazole.  EXAM: DUAL X-RAY ABSORPTIOMETRY (DXA) FOR BONE MINERAL DENSITY  FINDINGS: DISTAL THIRD OF THE LEFT RADIUS  Bone Mineral Density (BMD):  0.571 g/cm2  Young Adult T-Score:  -2.0  Z-Score:  0.0  LEFT FEMUR NECK  Bone Mineral Density (BMD):  0.606 g/cm2  Young Adult T-Score: -2.2  Z-Score:  -0.4  ASSESSMENT: Patient's diagnostic category is LOW BONE MASS by WHO Criteria.  FRAX: Based on the [place], the 10 year probability of a major osteoporotic fracture is 18%. The 10 year probability of a hip fracture is 3.4%.  COMPARISON: 02/03/2012. 3.8% decrease in density for the left proximal femur since the prior study. 4.1% decrease in density for the distal left radius.  Effective therapies are available in the form of bisphosphonates, selective estrogen receptor modulators, biologic agents, and hormone replacement therapy (for women). All patients should ensure an adequate intake of dietary calcium (1200 mg daily) and vitamin D (800 IU daily) unless contraindicated.  All treatment decisions require clinical judgment and consideration of individual patient factors, including patient preferences, co-morbidities, previous drug use, risk factors not captured in the FRAX model (e.g., frailty, falls, vitamin D deficiency, increased bone turnover, interval significant decline in bone density) and possible under- or over-estimation of fracture risk by FRAX.  The National Osteoporosis Foundation recommends that FDA-approved medical therapies be considered in postmenopausal women and men age 79 or older with a:  1. Hip or vertebral (clinical or morphometric) fracture.  2. T-score of -2.5 or lower at the spine or hip.  3. Ten-year fracture probability by FRAX of 3% or greater for hip fracture or  20% or greater for major osteoporotic fracture.  People with diagnosed cases of osteoporosis or at high risk for fracture should have regular bone mineral density tests. For patients eligible for Medicare, routine testing is allowed once every 2 years. The testing frequency can be increased to one year for patients who have rapidly progressing disease, those who are receiving or discontinuing medical therapy to restore bone mass, or have additional risk factors.  [facility] [employer]) Criteria:  Normal: T-scores from +1.0 to -1.0  Low Bone Mass (Osteopenia): T-scores between -1.0 and -2.5  Osteoporosis: T-scores -2.5 and below  Comparison to Reference Population:  T-score is the key measure used in the diagnosis of osteoporosis and relative risk determination for fracture. It provides a value for bone mass relative to the mean bone mass of a young adult reference population expressed in terms of standard deviation (SD).  Z-score is the age-matched score showing the patient's values compared to a population matched for age, sex, and race. This is also expressed in terms of standard deviation. The patient may have values that compare favorably to the age-matched values and still be at increased risk for fracture.   Electronically Signed   By: Lajean Manes M.D.   On: 02/03/2014 13:12   Mm Digital Diagnostic Bilat  02/03/2014   CLINICAL DATA:  Patient is a currently asymptomatic 68 year old female with a personal history of left breast cancer status post conservation therapy in 2012.  EXAM: DIGITAL DIAGNOSTIC  BILATERAL MAMMOGRAM WITH CAD  COMPARISON:  09/07/2008 through 02/04/2013 mammograms  ACR Breast Density Category b: There are scattered areas of fibroglandular density.  FINDINGS: Digital bilateral diagnostic mammography demonstrates scattered fibroglandular densities. Postsurgical scarring is again identified in the upper midline left breast at posterior depth at site of lumpectomy. Spot-compression  magnification view of the lumpectomy bed reveals expected postsurgical scarring and effacement of normal fibroglandular tissue. No new mass or suspicious microcalcification. Diffusely scattered high-density round and coarse calcification with unchanged benign features is again identified. There has been no significant interval change.  Mammographic images were processed with CAD.  IMPRESSION: Post lumpectomy scarring of the left breast without mammographic findings of malignancy.  RECOMMENDATION: Diagnostic mammogram is suggested in 1 year. (Code:DM-B-01Y)  I have discussed the findings and recommendations with the patient. Results were also provided in writing at the conclusion of the visit. If applicable, a reminder letter will be sent to the patient regarding the next appointment.  BI-RADS CATEGORY  2: Benign Finding(s)   Electronically Signed   By: Andres Shad   On: 02/03/2014 11:03  ASSESSMENT & PLAN:  Osteopenia with high risk of fracture Osteopenia with a T score of -2.2 in comparison to 2 years ago it was -1.6. I am worried about her risk of progression to osteoporosis. I recommended that she take calcium and vitamin D and along with that to take Boniva once a month. I provided her instructions that she should take it with a large glass of water and should not lie down after taking it. He should not take it with any other tablets with it. 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 Today's breast exam was also normal. She is tolerating antiestrogen therapy very well except for worsening osteopenia. I prescribed her Boniva along with calcium and vitamin D.  Discussed the importance of physical exercise in decreasing the likelihood of breast cancer recurrence. Recommended 30 mins daily 6 days a week of either brisk walking or cycling or swimming. Encouraged  patient to eat more fruits and vegetables and decrease red meat.   Return to clinic in one year for followup after another set of mammograms.    Orders Placed This Encounter  Procedures  . MM Digital Diagnostic Bilat    Standing Status: Future     Number of Occurrences:      Standing Expiration Date: 02/04/2015    Order Specific Question:  Reason for Exam (SYMPTOM  OR DIAGNOSIS REQUIRED)    Answer:  History of left breast cancer treated with lumpectomy and radiation annual followup    Order Specific Question:  Preferred imaging location?    Answer:  [facility]   The patient has a good understanding of the overall plan. she agrees with it. She will call with any problems that may develop before her next visit here.  I spent 20 minutes counseling the patient face to face. The total time spent in the appointment was 25 minutes and more than 50% was on counseling and review of test results    Rulon Eisenmenger, MD 02/04/2014 12:00 PM

## 2014-02-04 NOTE — Addendum Note (Signed)
Addended by: Jonelle Sports K on: 02/04/2014 01:00 PM   Modules accepted: Orders

## 2014-02-07 NOTE — Addendum Note (Signed)
Addended by: Prentiss Bells on: 02/07/2014 10:11 AM   Modules accepted: Orders

## 2014-02-11 ENCOUNTER — Encounter: Payer: Self-pay | Admitting: *Deleted

## 2014-02-11 NOTE — Progress Notes (Signed)
Received bone density results from [facility]. Sent to scan.

## 2014-04-11 ENCOUNTER — Other Ambulatory Visit: Payer: Self-pay | Admitting: *Deleted

## 2014-04-11 ENCOUNTER — Other Ambulatory Visit: Payer: Self-pay

## 2014-04-11 DIAGNOSIS — C50112 Malignant neoplasm of central portion of left female breast: Secondary | ICD-10-CM

## 2014-04-11 MED ORDER — ANASTROZOLE 1 MG PO TABS: 1.0000 mg | ORAL_TABLET | Freq: Every day | ORAL | Status: DC

## 2014-04-11 MED ORDER — ANASTROZOLE 1 MG PO TABS
1.0000 mg | ORAL_TABLET | Freq: Every day | ORAL | Status: DC
Start: 2014-04-11 — End: 2015-01-18

## 2014-04-11 NOTE — Telephone Encounter (Signed)
Per pt request, prescription for anastrazole with [facility] cancelled.  Order placed with [facility] on [address].  Let pt know order is placed.  Pt voiced understanding.

## 2014-07-22 ENCOUNTER — Other Ambulatory Visit: Payer: Self-pay | Admitting: Family Medicine

## 2014-07-22 DIAGNOSIS — E041 Nontoxic single thyroid nodule: Secondary | ICD-10-CM

## 2014-07-25 ENCOUNTER — Other Ambulatory Visit: Payer: Self-pay | Admitting: Family Medicine

## 2014-07-25 DIAGNOSIS — R911 Solitary pulmonary nodule: Secondary | ICD-10-CM

## 2014-07-26 ENCOUNTER — Ambulatory Visit
Admission: RE | Admit: 2014-07-26 | Discharge: 2014-07-26 | Disposition: A | Discharge status: Home / Self Care | Payer: PPO | Source: Ambulatory Visit | Attending: Family Medicine | Admitting: Family Medicine

## 2014-07-26 DIAGNOSIS — E041 Nontoxic single thyroid nodule: Secondary | ICD-10-CM

## 2014-07-29 ENCOUNTER — Inpatient Hospital Stay
Admission: RE | Admit: 2014-07-29 | Discharge: 2014-07-29 | Disposition: A | Discharge status: Home / Self Care | Payer: Self-pay | Source: Ambulatory Visit | Attending: Family Medicine | Admitting: Family Medicine

## 2014-07-29 ENCOUNTER — Other Ambulatory Visit: Payer: Self-pay

## 2014-07-29 ENCOUNTER — Ambulatory Visit
Admission: RE | Admit: 2014-07-29 | Discharge: 2014-07-29 | Disposition: A | Discharge status: Home / Self Care | Payer: PPO | Source: Ambulatory Visit | Attending: Family Medicine | Admitting: Family Medicine

## 2014-07-29 DIAGNOSIS — R911 Solitary pulmonary nodule: Secondary | ICD-10-CM

## 2014-07-29 MED ORDER — IOPAMIDOL (ISOVUE-300) INJECTION 61%
75.0000 mL | Freq: Once | INTRAVENOUS | Status: AC | PRN
  Administered 2014-07-29: 75 mL via INTRAVENOUS

## 2014-08-03 ENCOUNTER — Other Ambulatory Visit: Payer: Self-pay | Admitting: *Deleted

## 2014-08-03 DIAGNOSIS — M8580 Other specified disorders of bone density and structure, unspecified site: Secondary | ICD-10-CM

## 2014-08-03 MED ORDER — IBANDRONATE SODIUM 150 MG PO TABS: 150.0000 mg | ORAL_TABLET | ORAL | Status: DC

## 2015-01-18 ENCOUNTER — Other Ambulatory Visit: Payer: Self-pay | Admitting: *Deleted

## 2015-01-18 DIAGNOSIS — C50112 Malignant neoplasm of central portion of left female breast: Secondary | ICD-10-CM

## 2015-01-18 MED ORDER — ANASTROZOLE 1 MG PO TABS: 1.0000 mg | ORAL_TABLET | Freq: Every day | ORAL | Status: DC

## 2015-01-30 ENCOUNTER — Other Ambulatory Visit: Payer: Self-pay | Admitting: Family Medicine

## 2015-01-30 DIAGNOSIS — Z853 Personal history of malignant neoplasm of breast: Secondary | ICD-10-CM

## 2015-01-31 ENCOUNTER — Other Ambulatory Visit: Payer: Self-pay

## 2015-02-01 ENCOUNTER — Telehealth: Payer: Self-pay | Admitting: Hematology and Oncology

## 2015-02-01 NOTE — Telephone Encounter (Signed)
Appointments made and moved per pof and patient  aware

## 2015-02-07 ENCOUNTER — Ambulatory Visit: Payer: Medicare Other | Admitting: Hematology and Oncology

## 2015-03-07 ENCOUNTER — Ambulatory Visit: Payer: PPO

## 2015-03-07 ENCOUNTER — Ambulatory Visit
Admission: RE | Admit: 2015-03-07 | Discharge: 2015-03-07 | Disposition: A | Discharge status: Home / Self Care | Payer: PPO | Source: Ambulatory Visit | Attending: Family Medicine | Admitting: Family Medicine

## 2015-03-07 DIAGNOSIS — Z853 Personal history of malignant neoplasm of breast: Secondary | ICD-10-CM

## 2015-03-09 NOTE — Assessment & Plan Note (Signed)
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 Today's breast exam was also normal. She is tolerating antiestrogen therapy very well except for worsening osteopenia. I prescribed her Boniva along with calcium and vitamin D.  Breast Cancer Surveillance: 1. Breast exam 03/10/2015: Normal 2. Mammogram 03/07/2015 No abnormalities. Postsurgical changes. Breast Density Category B. I recommended that she get 3-D mammograms for surveillance. Discussed the differences between different breast density categories.   Return to clinic in one year for followup after another set of mammograms.  Osteopenia with a T score of -2.2 in comparison to 2 years ago it was -1.6. I am worried about her risk of progression to osteoporosis. I recommended that she take calcium and vitamin D and along with that to take Boniva once a month

## 2015-03-10 ENCOUNTER — Telehealth: Payer: Self-pay | Admitting: Hematology and Oncology

## 2015-03-10 ENCOUNTER — Encounter: Payer: Self-pay | Admitting: Hematology and Oncology

## 2015-03-10 ENCOUNTER — Ambulatory Visit (HOSPITAL_BASED_OUTPATIENT_CLINIC_OR_DEPARTMENT_OTHER): Payer: PPO | Admitting: Hematology and Oncology

## 2015-03-10 VITALS — BP 116/42 | HR 69 | Temp 98.3°F | Resp 19 | Ht 65.0 in | Wt 221.6 lb

## 2015-03-10 DIAGNOSIS — M8580 Other specified disorders of bone density and structure, unspecified site: Secondary | ICD-10-CM

## 2015-03-10 DIAGNOSIS — I10 Essential (primary) hypertension: Secondary | ICD-10-CM | POA: Diagnosis not present

## 2015-03-10 DIAGNOSIS — L02421 Furuncle of right axilla: Secondary | ICD-10-CM

## 2015-03-10 DIAGNOSIS — C50112 Malignant neoplasm of central portion of left female breast: Secondary | ICD-10-CM

## 2015-03-10 NOTE — Addendum Note (Signed)
Addended by: Prentiss Bells on: 03/10/2015 01:07 PM   Modules accepted: Medications

## 2015-03-10 NOTE — Telephone Encounter (Signed)
Called patient and she is aware of her appointments °

## 2015-03-10 NOTE — Progress Notes (Signed)
Patient Care Team: Jani Gravel, MD as PCP - General (Internal Medicine) Consuela Mimes, MD as Consulting Physician (Internal Medicine) Jackolyn Confer, MD as Consulting Physician (General Surgery) Thea Silversmith, MD as Consulting Physician (Radiation Oncology)  DIAGNOSIS: Cancer of central portion of left female breast [facility])   Staging form: Breast, AJCC 7th Edition     Pathologic: Stage IIA (T2, N0, cM0) - Signed by Thea Silversmith, MD on 04/03/2011   SUMMARY OF ONCOLOGIC HISTORY:   Cancer of central portion of left female breast ([place])   02/01/2011 Initial Diagnosis 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: 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) ER 100%, PR 80%, Ki-67 14%, HER-2 negative ratio 0.97   02/28/2011 Surgery Left breast lumpectomy: Invasive ductal carcinoma grade 2, 2.3 cm, intermediate grade DCIS, 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 She appears to be tolerating it extremely well. Denies any hot flashes or myalgias. For osteoporosis she was started on Boniva. She takes it once a month. She appears to be tolerating it extremely well. She complains of a boil under the arm which keeps coming back. She has been on 2 antibiotics without any benefit. She had previously had Dr. Zella Richer drain it but it had come back again. It wakes her up at night because of pain.  REVIEW OF SYSTEMS:   Constitutional: Denies fevers, chills or abnormal weight loss Eyes: Denies blurriness of vision Ears, nose, mouth, throat, and face: Denies mucositis or  sore throat Respiratory: Denies cough, dyspnea or wheezes Cardiovascular: Denies palpitation, chest discomfort or lower extremity swelling Gastrointestinal:  Denies nausea, heartburn or change in bowel habits Skin: Denies abnormal skin rashes Lymphatics: Denies new lymphadenopathy or easy bruising Neurological:Denies numbness, tingling or new weaknesses Behavioral/Psych: Mood is stable, no new changes  Breast: Boil under the arm All other systems were reviewed with the patient and are negative.  I have reviewed the past medical history, past surgical history, social history and family history with the patient and they are unchanged from previous note.  ALLERGIES:  is allergic to zithromax.  MEDICATIONS:  Current Outpatient Prescriptions  Medication Sig Dispense Refill  . acetaminophen (TYLENOL) 500 MG tablet Take 1,000 mg by mouth every 6 (six) hours as needed for moderate pain.    Marland Kitchen anastrozole (ARIMIDEX) 1 MG tablet Take 1 tablet (1 mg total) by mouth daily. 90 tablet 0  . Calcium Carbonate-Vitamin D (CALTRATE 600+D PO) Take 1 tablet by mouth every morning.     . ibandronate (BONIVA) 150 MG tablet Take 1 tablet (150 mg total) by mouth every 30 (thirty) days. in am w/water on empty stomach, for 30 min-nothing by mouth & don't lie down 3 tablet 3  . latanoprost (XALATAN) 0.005 % ophthalmic solution Place 1 drop into both eyes 2 (two) times daily.     Marland Kitchen lisinopril-hydrochlorothiazide (PRINZIDE,ZESTORETIC) 20-12.5 MG per tablet Take 1 tablet by mouth every morning.     . lovastatin (MEVACOR) 20 MG tablet Take 20 mg by mouth every morning.     . metoprolol (LOPRESSOR) 50 MG tablet Take  50 mg by mouth 2 (two) times daily.     . timolol (TIMOPTIC) 0.5 % ophthalmic solution Place 1 drop into both eyes every morning.      No current facility-administered medications for this visit.    PHYSICAL EXAMINATION: ECOG PERFORMANCE STATUS: 1 - Symptomatic but completely ambulatory  Filed Vitals:    03/10/15 0922  BP: 116/42  Pulse: 69  Temp: 98.3 F (36.8 C)  Resp: 19   Filed Weights   03/10/15 0922  Weight: 221 lb 9.6 oz (100.517 kg)    GENERAL:alert, no distress and comfortable SKIN: skin color, texture, turgor are normal, no rashes or significant lesions EYES: normal, Conjunctiva are pink and non-injected, sclera clear OROPHARYNX:no exudate, no erythema and lips, buccal mucosa, and tongue normal  NECK: supple, thyroid normal size, non-tender, without nodularity LYMPH:  no palpable lymphadenopathy in the cervical, axillary or inguinal LUNGS: clear to auscultation and percussion with normal breathing effort HEART: regular rate & rhythm and no murmurs and no lower extremity edema ABDOMEN:abdomen soft, non-tender and normal bowel sounds Musculoskeletal:no cyanosis of digits and no clubbing  NEURO: alert & oriented x 3 with fluent speech, no focal motor/sensory deficits BREAST: No palpable masses or nodules in either right or left breasts. Right axilla boil is palpable but it does not appear to be draining. No palpable axillary supraclavicular or infraclavicular adenopathy no breast tenderness or nipple discharge. (exam performed in the presence of a chaperone)  LABORATORY DATA:  I have reviewed the data as listed   Chemistry      Component Value Date/Time   NA 142 02/04/2014 1016   NA 138 04/14/2013 1130   K 3.6 02/04/2014 1016   K 3.5 04/14/2013 1130   CL 101 04/14/2013 1130   CL 102 10/30/2012 1421   CO2 28 02/04/2014 1016   CO2 24 04/14/2013 1130   BUN 21.6 02/04/2014 1016   BUN 15 04/14/2013 1130   CREATININE 0.9 02/04/2014 1016   CREATININE 0.81 04/14/2013 1130      Component Value Date/Time   CALCIUM 9.7 02/04/2014 1016   CALCIUM 9.7 04/14/2013 1130   ALKPHOS 68 02/04/2014 1016   ALKPHOS 82 04/14/2013 1130   AST 11 02/04/2014 1016   AST 17 04/14/2013 1130   ALT 11 02/04/2014 1016   ALT 16 04/14/2013 1130   BILITOT 0.70 02/04/2014 1016   BILITOT 0.6  04/14/2013 1130       Lab Results  Component Value Date   WBC 8.7 02/04/2014   HGB 12.8 02/04/2014   HCT 39.2 02/04/2014   MCV 86.6 02/04/2014   PLT 252 02/04/2014   NEUTROABS 6.0 02/04/2014   ASSESSMENT & PLAN:  Cancer of central portion of left female breast ([place]) 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 Today's breast exam was also normal. She is tolerating antiestrogen therapy very well except for worsening osteopenia. I prescribed her Boniva along with calcium and vitamin D.  Breast Cancer Surveillance: 1. Breast exam 03/10/2015: Normal except for the boil under the axilla on the right side 2. Mammogram 03/07/2015 No abnormalities. Postsurgical changes. Breast Density Category B. I recommended that she get 3-D mammograms for surveillance. Discussed the differences between different breast density categories.   Osteopenia with a T score of -2.2 in comparison to 2 years ago it was -1.6. I am worried about her risk of progression  to osteoporosis. I recommended that she take calcium and vitamin D and along with that to take Boniva once a month  Hypertension: Patient is very concerned about her blood pressure. She is worried that the diastolic blood pressures too low. She does not have any dizziness or lightheadedness. I encouraged her to discuss this with her primary care physician.  Boil under the right arm: Patient will call Dr. Zella Richer to get it drained again.  Return to clinic in one year for followup after another set of mammograms. No orders of the defined types were placed in this encounter.   The patient has a good understanding of the overall plan. she agrees with it. she will call with any problems that may develop before the next visit here.   Rulon Eisenmenger, MD 03/10/2015

## 2015-04-10 ENCOUNTER — Other Ambulatory Visit: Payer: Self-pay | Admitting: Hematology and Oncology

## 2015-04-10 DIAGNOSIS — C50112 Malignant neoplasm of central portion of left female breast: Secondary | ICD-10-CM

## 2015-07-10 DIAGNOSIS — H1851 Endothelial corneal dystrophy: Secondary | ICD-10-CM | POA: Diagnosis not present

## 2015-07-10 DIAGNOSIS — H401134 Primary open-angle glaucoma, bilateral, indeterminate stage: Secondary | ICD-10-CM | POA: Diagnosis not present

## 2015-07-10 DIAGNOSIS — H2703 Aphakia, bilateral: Secondary | ICD-10-CM | POA: Diagnosis not present

## 2015-07-10 DIAGNOSIS — H18423 Band keratopathy, bilateral: Secondary | ICD-10-CM | POA: Diagnosis not present

## 2015-07-31 ENCOUNTER — Other Ambulatory Visit: Payer: Self-pay | Admitting: *Deleted

## 2015-07-31 DIAGNOSIS — M8580 Other specified disorders of bone density and structure, unspecified site: Secondary | ICD-10-CM

## 2015-07-31 MED ORDER — IBANDRONATE SODIUM 150 MG PO TABS: 150.0000 mg | ORAL_TABLET | ORAL | Status: DC

## 2015-09-21 ENCOUNTER — Other Ambulatory Visit: Payer: Self-pay

## 2015-09-21 ENCOUNTER — Telehealth: Payer: Self-pay

## 2015-09-21 DIAGNOSIS — C50112 Malignant neoplasm of central portion of left female breast: Secondary | ICD-10-CM

## 2015-09-21 DIAGNOSIS — M8580 Other specified disorders of bone density and structure, unspecified site: Secondary | ICD-10-CM

## 2015-09-21 NOTE — Telephone Encounter (Signed)
Pt called requesting referral for follow up bone density.  Pt will be due 02/04/16.  [facility].  Returned phone call and informed pt this referral has been done.  Also discussed with pt her next appointment with Dr. Lindi Adie for 1 year follow up in October.  Pt verbalized understanding and no further questions at this time.

## 2015-11-22 DIAGNOSIS — H1820 Unspecified corneal edema: Secondary | ICD-10-CM | POA: Diagnosis not present

## 2015-11-22 DIAGNOSIS — H18423 Band keratopathy, bilateral: Secondary | ICD-10-CM | POA: Diagnosis not present

## 2015-11-28 ENCOUNTER — Other Ambulatory Visit: Payer: Self-pay | Admitting: Hematology and Oncology

## 2015-11-28 DIAGNOSIS — Z853 Personal history of malignant neoplasm of breast: Secondary | ICD-10-CM

## 2015-12-13 DIAGNOSIS — H401134 Primary open-angle glaucoma, bilateral, indeterminate stage: Secondary | ICD-10-CM | POA: Diagnosis not present

## 2016-03-06 ENCOUNTER — Other Ambulatory Visit: Payer: Self-pay

## 2016-03-06 ENCOUNTER — Other Ambulatory Visit: Payer: Self-pay | Admitting: Hematology and Oncology

## 2016-03-06 DIAGNOSIS — Z853 Personal history of malignant neoplasm of breast: Secondary | ICD-10-CM

## 2016-03-07 ENCOUNTER — Ambulatory Visit
Admission: RE | Admit: 2016-03-07 | Discharge: 2016-03-07 | Disposition: A | Discharge status: Home / Self Care | Payer: PPO | Source: Ambulatory Visit | Attending: Hematology and Oncology | Admitting: Hematology and Oncology

## 2016-03-07 ENCOUNTER — Other Ambulatory Visit: Payer: Self-pay | Admitting: Hematology and Oncology

## 2016-03-07 DIAGNOSIS — N6320 Unspecified lump in the left breast, unspecified quadrant: Secondary | ICD-10-CM

## 2016-03-07 DIAGNOSIS — R922 Inconclusive mammogram: Secondary | ICD-10-CM | POA: Diagnosis not present

## 2016-03-07 DIAGNOSIS — N6489 Other specified disorders of breast: Secondary | ICD-10-CM | POA: Diagnosis not present

## 2016-03-07 DIAGNOSIS — M8589 Other specified disorders of bone density and structure, multiple sites: Secondary | ICD-10-CM | POA: Diagnosis not present

## 2016-03-07 DIAGNOSIS — Z780 Asymptomatic menopausal state: Secondary | ICD-10-CM | POA: Diagnosis not present

## 2016-03-07 DIAGNOSIS — Z853 Personal history of malignant neoplasm of breast: Secondary | ICD-10-CM

## 2016-03-07 DIAGNOSIS — C50112 Malignant neoplasm of central portion of left female breast: Secondary | ICD-10-CM

## 2016-03-07 DIAGNOSIS — M8580 Other specified disorders of bone density and structure, unspecified site: Secondary | ICD-10-CM

## 2016-03-11 ENCOUNTER — Ambulatory Visit (HOSPITAL_BASED_OUTPATIENT_CLINIC_OR_DEPARTMENT_OTHER): Payer: PPO | Admitting: Hematology and Oncology

## 2016-03-11 DIAGNOSIS — Z79811 Long term (current) use of aromatase inhibitors: Secondary | ICD-10-CM

## 2016-03-11 DIAGNOSIS — Z170 Estrogen receptor positive status [ER+]: Principal | ICD-10-CM

## 2016-03-11 DIAGNOSIS — I10 Essential (primary) hypertension: Secondary | ICD-10-CM | POA: Diagnosis not present

## 2016-03-11 DIAGNOSIS — C50112 Malignant neoplasm of central portion of left female breast: Secondary | ICD-10-CM

## 2016-03-11 DIAGNOSIS — M8580 Other specified disorders of bone density and structure, unspecified site: Secondary | ICD-10-CM

## 2016-03-11 NOTE — Progress Notes (Signed)
Patient Care Team: Jani Gravel, MD as PCP - General (Internal Medicine) Marcy Panning, MD as Consulting Physician (Internal Medicine) Jackolyn Confer, MD as Consulting Physician (General Surgery) Thea Silversmith, MD as Consulting Physician (Radiation Oncology)  DIAGNOSIS:  Encounter Diagnosis  Name Primary?  . Malignant neoplasm of central portion of left breast in female, estrogen receptor positive ([place])     SUMMARY OF ONCOLOGIC HISTORY:   Cancer of central portion of left female breast ([place])   02/01/2011 Initial Diagnosis    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: 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) ER 100%, PR 80%, Ki-67 14%, HER-2 negative ratio 0.97      02/28/2011 Surgery    Left breast lumpectomy: Invasive ductal carcinoma grade 2, 2.3 cm, intermediate grade DCIS, 2 SLN negative: ER 100%, PR 80%, Ki-67 14%, HER-2 negative ratio 0.97, Oncotype DX score 13 low risk ROR 9%      04/07/2011 - 05/07/2011 Radiation Therapy    Radiation therapy to lumpectomy site      06/21/2011 -  Anti-estrogen oral therapy    Arimidex 1 mg daily       CHIEF COMPLIANT: Follow-up on Arimidex  INTERVAL HISTORY: [name] is a 70 year old with above-mentioned history left breast cancer treated with lumpectomy and radiation and is currently on Arimidex therapy. She is tolerating it fairly well. She had poor bone density and osteopenia for which she is on Boniva. Recent bone density test showed that she had marked improvement in the bone density. The T score improved from -2.2 all the way to -1.7. This was her bone density prior to starting anastrozole therapy. She denies any lumps or nodules in the breast. Most recent mammograms were normal.  REVIEW OF SYSTEMS:   Constitutional: Denies fevers, chills or abnormal weight loss Eyes: Denies blurriness of vision Ears, nose, mouth, throat, and face: Denies mucositis or sore throat Respiratory:  Denies cough, dyspnea or wheezes Cardiovascular: Denies palpitation, chest discomfort Gastrointestinal:  Denies nausea, heartburn or change in bowel habits Skin: Denies abnormal skin rashes Lymphatics: Denies new lymphadenopathy or easy bruising Neurological:Denies numbness, tingling or new weaknesses Behavioral/Psych: Mood is stable, no new changes  Extremities: No lower extremity edema Breast:  denies any pain or lumps or nodules in either breasts All other systems were reviewed with the patient and are negative.  I have reviewed the past medical history, past surgical history, social history and family history with the patient and they are unchanged from previous note.  ALLERGIES:  is allergic to zithromax [azithromycin].  MEDICATIONS:  Current Outpatient Prescriptions  Medication Sig Dispense Refill  . acetaminophen (TYLENOL) 500 MG tablet Take 1,000 mg by mouth every 6 (six) hours as needed for moderate pain.    Marland Kitchen anastrozole (ARIMIDEX) 1 MG tablet TAKE ONE TABLET BY MOUTH ONCE DAILY 90 tablet 3  . Calcium Carbonate-Vitamin D (CALTRATE 600+D PO) Take 1 tablet by mouth every morning.     . ibandronate (BONIVA) 150 MG tablet Take 1 tablet (150 mg total) by mouth every 30 (thirty) days. in am w/water on empty stomach, for 30 min-nothing by mouth & don't lie down 3 tablet 3  . latanoprost (XALATAN) 0.005 % ophthalmic solution Place 1 drop into both eyes 2 (two) times daily.     Marland Kitchen lisinopril-hydrochlorothiazide (PRINZIDE,ZESTORETIC) 20-12.5 MG per tablet Take 1 tablet by mouth every morning.     . lovastatin (MEVACOR) 20 MG tablet Take 20 mg by  mouth every morning.     . metoprolol (LOPRESSOR) 50 MG tablet Take 50 mg by mouth 2 (two) times daily.     . timolol (TIMOPTIC) 0.5 % ophthalmic solution Place 1 drop into both eyes every morning.      No current facility-administered medications for this visit.     PHYSICAL EXAMINATION: ECOG PERFORMANCE STATUS: 1 - Symptomatic but completely  ambulatory  Vitals:   03/11/16 0923  BP: (!) 127/53  Pulse: 67  Resp: 18  Temp: 99 F (37.2 C)   Filed Weights   03/11/16 0923  Weight: 219 lb 1.6 oz (99.4 kg)    GENERAL:alert, no distress and comfortable SKIN: skin color, texture, turgor are normal, no rashes or significant lesions EYES: normal, Conjunctiva are pink and non-injected, sclera clear OROPHARYNX:no exudate, no erythema and lips, buccal mucosa, and tongue normal  NECK: supple, thyroid normal size, non-tender, without nodularity LYMPH:  no palpable lymphadenopathy in the cervical, axillary or inguinal LUNGS: clear to auscultation and percussion with normal breathing effort HEART: regular rate & rhythm and no murmurs and no lower extremity edema ABDOMEN:abdomen soft, non-tender and normal bowel sounds MUSCULOSKELETAL:no cyanosis of digits and no clubbing  NEURO: alert & oriented x 3 with fluent speech, no focal motor/sensory deficits EXTREMITIES: No lower extremity edema BREAST: No palpable masses or nodules in either right or left breasts. No palpable axillary supraclavicular or infraclavicular adenopathy no breast tenderness or nipple discharge. (exam performed in the presence of a chaperone)  LABORATORY DATA:  I have reviewed the data as listed   Chemistry      Component Value Date/Time   NA 142 02/04/2014 1016   K 3.6 02/04/2014 1016   CL 101 04/14/2013 1130   CL 102 10/30/2012 1421   CO2 28 02/04/2014 1016   BUN 21.6 02/04/2014 1016   CREATININE 0.9 02/04/2014 1016      Component Value Date/Time   CALCIUM 9.7 02/04/2014 1016   ALKPHOS 68 02/04/2014 1016   AST 11 02/04/2014 1016   ALT 11 02/04/2014 1016   BILITOT 0.70 02/04/2014 1016       Lab Results  Component Value Date   WBC 8.7 02/04/2014   HGB 12.8 02/04/2014   HCT 39.2 02/04/2014   MCV 86.6 02/04/2014   PLT 252 02/04/2014   NEUTROABS 6.0 02/04/2014     ASSESSMENT & PLAN:  Cancer of central portion of left female breast (HCC) Left  side T2 NX Mx ER 100% PR 88% Ki67 14% Her 2 Neu 0.97  Current treatment: Anastrozole 1 mg by mouth daily started 06/21/2011 Anastrozole toxicities: Osteopenia  Breast Cancer Surveillance: 1. Breast exam 03/11/2016: Normal  2. Mammogram 03/08/2015 No abnormalities. Postsurgical changes. Breast Density Category B. mammogram and ultrasound were performed. I recommended that she get 3-D mammograms for surveillance. Discussed the differences between different breast density categories.   Osteopenia with a T score of -1.7 which has improved from -2.2 in comparison to  -1.6 prior to starting anastrozole. Currently on calcium and vitamin D and along with Boniva once a month. Patient will finish Boniva and anastrozole in February 2018.  Hypertension:  good control  I offered the patient and annual follow-ups with surveillance of the survivorship clinic. Patient is more keen on following with her primary care physician instead. I asked her to call me or come back to see me on an as-needed basis.   No orders of the defined types were placed in this encounter.  The patient has a  good understanding of the overall plan. she agrees with it. she will call with any problems that may develop before the next visit here.   Rulon Eisenmenger, MD 03/11/16

## 2016-03-11 NOTE — Assessment & Plan Note (Signed)
Left side T2 NX Mx ER 100% PR 88% Ki67 14% Her 2 Neu 0.97  Current treatment: Anastrozole 1 mg by mouth daily started 06/21/2011 Anastrozole toxicities: Osteopenia  Breast Cancer Surveillance: 1. Breast exam 03/11/2016: Normal  2. Mammogram 03/08/2015 No abnormalities. Postsurgical changes. Breast Density Category B. mammogram and ultrasound were performed. I recommended that she get 3-D mammograms for surveillance. Discussed the differences between different breast density categories.   Osteopenia with a T score of -2.2 in comparison to 2 years ago it was -1.6. Currently on calcium and vitamin D and along with Boniva once a month  Hypertension: Patient is very concerned about her blood pressure. She is worried that the diastolic blood pressures too low. She does not have any dizziness or lightheadedness. I encouraged her to discuss this with her primary care physician.  Return to clinic in one year for followup after another set of mammograms.

## 2016-03-18 ENCOUNTER — Other Ambulatory Visit: Payer: Self-pay | Admitting: Hematology and Oncology

## 2016-03-18 DIAGNOSIS — C50112 Malignant neoplasm of central portion of left female breast: Secondary | ICD-10-CM

## 2016-05-24 DIAGNOSIS — I10 Essential (primary) hypertension: Secondary | ICD-10-CM | POA: Diagnosis not present

## 2016-05-24 DIAGNOSIS — F4320 Adjustment disorder, unspecified: Secondary | ICD-10-CM | POA: Diagnosis not present

## 2016-05-24 DIAGNOSIS — R7303 Prediabetes: Secondary | ICD-10-CM | POA: Diagnosis not present

## 2016-05-24 DIAGNOSIS — R413 Other amnesia: Secondary | ICD-10-CM | POA: Diagnosis not present

## 2016-05-24 DIAGNOSIS — Z853 Personal history of malignant neoplasm of breast: Secondary | ICD-10-CM | POA: Diagnosis not present

## 2016-05-24 DIAGNOSIS — R911 Solitary pulmonary nodule: Secondary | ICD-10-CM | POA: Diagnosis not present

## 2016-05-24 DIAGNOSIS — E782 Mixed hyperlipidemia: Secondary | ICD-10-CM | POA: Diagnosis not present

## 2016-05-24 DIAGNOSIS — H548 Legal blindness, as defined in USA: Secondary | ICD-10-CM | POA: Diagnosis not present

## 2016-06-12 ENCOUNTER — Other Ambulatory Visit: Payer: Self-pay | Admitting: Family Medicine

## 2016-06-12 DIAGNOSIS — R911 Solitary pulmonary nodule: Secondary | ICD-10-CM

## 2016-08-23 ENCOUNTER — Ambulatory Visit
Admission: RE | Admit: 2016-08-23 | Discharge: 2016-08-23 | Disposition: A | Discharge status: Home / Self Care | Payer: PPO | Source: Ambulatory Visit | Attending: Family Medicine | Admitting: Family Medicine

## 2016-08-23 DIAGNOSIS — R911 Solitary pulmonary nodule: Secondary | ICD-10-CM

## 2016-08-23 DIAGNOSIS — R918 Other nonspecific abnormal finding of lung field: Secondary | ICD-10-CM | POA: Diagnosis not present

## 2016-09-20 DIAGNOSIS — E119 Type 2 diabetes mellitus without complications: Secondary | ICD-10-CM | POA: Diagnosis not present

## 2016-09-30 DIAGNOSIS — H401134 Primary open-angle glaucoma, bilateral, indeterminate stage: Secondary | ICD-10-CM | POA: Diagnosis not present

## 2016-09-30 DIAGNOSIS — H3589 Other specified retinal disorders: Secondary | ICD-10-CM | POA: Diagnosis not present

## 2016-09-30 DIAGNOSIS — H18423 Band keratopathy, bilateral: Secondary | ICD-10-CM | POA: Diagnosis not present

## 2016-09-30 DIAGNOSIS — H2703 Aphakia, bilateral: Secondary | ICD-10-CM | POA: Diagnosis not present

## 2016-10-02 DIAGNOSIS — Z853 Personal history of malignant neoplasm of breast: Secondary | ICD-10-CM | POA: Diagnosis not present

## 2017-01-10 DIAGNOSIS — I10 Essential (primary) hypertension: Secondary | ICD-10-CM | POA: Diagnosis not present

## 2017-01-10 DIAGNOSIS — E782 Mixed hyperlipidemia: Secondary | ICD-10-CM | POA: Diagnosis not present

## 2017-01-10 DIAGNOSIS — R413 Other amnesia: Secondary | ICD-10-CM | POA: Diagnosis not present

## 2017-01-10 DIAGNOSIS — E119 Type 2 diabetes mellitus without complications: Secondary | ICD-10-CM | POA: Diagnosis not present

## 2017-01-16 ENCOUNTER — Emergency Department (HOSPITAL_COMMUNITY)
Admission: EM | Admit: 2017-01-16 | Discharge: 2017-01-16 | Disposition: A | Discharge status: Home / Self Care | Payer: PPO | Attending: Emergency Medicine | Admitting: Emergency Medicine

## 2017-01-16 ENCOUNTER — Emergency Department (HOSPITAL_COMMUNITY): Payer: PPO

## 2017-01-16 ENCOUNTER — Encounter (HOSPITAL_COMMUNITY): Payer: Self-pay | Admitting: Emergency Medicine

## 2017-01-16 DIAGNOSIS — I10 Essential (primary) hypertension: Secondary | ICD-10-CM | POA: Insufficient documentation

## 2017-01-16 DIAGNOSIS — R109 Unspecified abdominal pain: Secondary | ICD-10-CM

## 2017-01-16 DIAGNOSIS — Z79899 Other long term (current) drug therapy: Secondary | ICD-10-CM | POA: Diagnosis not present

## 2017-01-16 DIAGNOSIS — R197 Diarrhea, unspecified: Secondary | ICD-10-CM | POA: Diagnosis not present

## 2017-01-16 DIAGNOSIS — M25572 Pain in left ankle and joints of left foot: Secondary | ICD-10-CM

## 2017-01-16 LAB — URINALYSIS, ROUTINE W REFLEX MICROSCOPIC
Bilirubin Urine: NEGATIVE
Glucose, UA: NEGATIVE mg/dL
Ketones, ur: NEGATIVE mg/dL
Nitrite: NEGATIVE
PH: 5.5 (ref 5.0–8.0)
Protein, ur: NEGATIVE mg/dL
SPECIFIC GRAVITY, URINE: 1.015 (ref 1.005–1.030)

## 2017-01-16 LAB — COMPREHENSIVE METABOLIC PANEL
ALBUMIN: 4.1 g/dL (ref 3.5–5.0)
ALK PHOS: 63 U/L (ref 38–126)
ALT: 18 U/L (ref 14–54)
AST: 20 U/L (ref 15–41)
Anion gap: 6 (ref 5–15)
BUN: 34 mg/dL — AB (ref 6–20)
CALCIUM: 10 mg/dL (ref 8.9–10.3)
CO2: 29 mmol/L (ref 22–32)
CREATININE: 1.02 mg/dL — AB (ref 0.44–1.00)
Chloride: 103 mmol/L (ref 101–111)
GFR calc Af Amer: >60 mL/min (ref >60)
GFR calc non Af Amer: 53 mL/min — ABNORMAL LOW (ref >60)
GLUCOSE: 106 mg/dL — AB (ref 65–99)
Potassium: 3.4 mmol/L — ABNORMAL LOW (ref 3.5–5.1)
SODIUM: 138 mmol/L (ref 135–145)
Total Bilirubin: 1.1 mg/dL (ref 0.3–1.2)
Total Protein: 7.5 g/dL (ref 6.5–8.1)

## 2017-01-16 LAB — CBC
HCT: 39.8 % (ref 36.0–46.0)
HEMOGLOBIN: 13.6 g/dL (ref 12.0–15.0)
MCH: 29.1 pg (ref 26.0–34.0)
MCHC: 34.2 g/dL (ref 30.0–36.0)
MCV: 85 fL (ref 78.0–100.0)
PLATELETS: 287 10*3/uL (ref 150–400)
RBC: 4.68 MIL/uL (ref 3.87–5.11)
RDW: 14 % (ref 11.5–15.5)
WBC: 10.6 10*3/uL — ABNORMAL HIGH (ref 4.0–10.5)

## 2017-01-16 LAB — URINALYSIS, MICROSCOPIC (REFLEX)

## 2017-01-16 LAB — LIPASE, BLOOD: Lipase: 37 U/L (ref 11–51)

## 2017-01-16 MED ORDER — IOPAMIDOL (ISOVUE-300) INJECTION 61%
100.0000 mL | Freq: Once | INTRAVENOUS | Status: AC | PRN
  Administered 2017-01-16: 100 mL via INTRAVENOUS

## 2017-01-16 MED ORDER — ONDANSETRON 4 MG PO TBDP: 4.0000 mg | ORAL_TABLET | Freq: Once | ORAL | Status: DC | PRN

## 2017-01-16 MED ORDER — CEPHALEXIN 500 MG PO CAPS
500.0000 mg | ORAL_CAPSULE | Freq: Once | ORAL | Status: AC
  Administered 2017-01-16: 500 mg via ORAL
  Filled 2017-01-16: qty 1

## 2017-01-16 MED ORDER — IOPAMIDOL (ISOVUE-300) INJECTION 61%
INTRAVENOUS | Status: AC
  Filled 2017-01-16: qty 100

## 2017-01-16 MED ORDER — MORPHINE SULFATE (PF) 4 MG/ML IV SOLN
4.0000 mg | Freq: Once | INTRAVENOUS | Status: AC
  Administered 2017-01-16: 4 mg via INTRAVENOUS
  Filled 2017-01-16: qty 1

## 2017-01-16 NOTE — ED Triage Notes (Signed)
Patient c/o left foot and leg pain that started this morning when woke up. Patient reports that pain is worse with weight bearing.  Patient also noticed pain in right leg when she was trying to walk down stairs this morning.  Patient also c/o intermittent abd pain that started this morning as well.  Patient reports when she tries to get up out of chair she gets SOB and abd pain with nausea.  Patient adds that she had diarrhea this morning x one episode.  Patient reports that she was told that she has aneurysm and if ever have abd pain need to go to ED.

## 2017-01-23 ENCOUNTER — Ambulatory Visit: Payer: PPO | Admitting: Neurology

## 2017-01-24 NOTE — ED Provider Notes (Signed)
[facility] Provider Note   CSN: [id number] Arrival date & time: 01/16/17  0959     History   Chief Complaint Chief Complaint  Patient presents with  . Leg Pain  . Abdominal Pain  . Nausea    HPI [name] is a 71 y.o. female.  HPI Patient presents to the emergency department with complaints of bilateral foot and leg pain left greater than right.  She also reports new intermittent lower to mid abdominal pain.  She reports his pain is worse with movement and palpation.  She reports nausea without vomiting.  Reports one episode of nonbloody diarrhea.  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  Denies left upper quadrant pain.  No back pain flank pain.    Past Medical History:  Diagnosis Date  . Blind   . Cancer (HCC)    Left Breast  . Glaucoma   . Hyperlipidemia   . Hypertension   . Splenic artery aneurysm (HCC)    8 mm    Patient Active Problem List   Diagnosis Date Noted  . Osteopenia with high risk of fracture 02/04/2014  . Cancer of central portion of left female breast ([place]) 02/13/2011    Past Surgical History:  Procedure Laterality Date  . ABDOMINAL HYSTERECTOMY    . BREAST SURGERY  02/28/11   Left PM and SLNBx  . CESAREAN SECTION     twice  . EYE SURGERY     multiple  . TONSILLECTOMY  2006    OB History    No data available       Home Medications    Prior to Admission medications   Medication Sig Start Date End Date Taking? Authorizing Provider  Calcium Carbonate-Vitamin D (CALTRATE 600+D PO) Take 1 tablet by mouth every morning.    Yes [provider]  latanoprost (XALATAN) 0.005 % ophthalmic solution Place 1 drop into both eyes 2 (two) times daily.  12/10/10  Yes [provider]  lisinopril-hydrochlorothiazide (PRINZIDE,ZESTORETIC) 20-12.5 MG per tablet Take 1 tablet by mouth every morning.  01/31/11  Yes [provider]  lovastatin (MEVACOR) 40 MG tablet Take 40 mg by mouth every  morning.  12/27/10  Yes [provider]  metoprolol (LOPRESSOR) 50 MG tablet Take 50 mg by mouth daily.  01/31/11  Yes [provider]  timolol (TIMOPTIC) 0.5 % ophthalmic solution Place 1 drop into both eyes every morning.  12/31/10  Yes [provider]  anastrozole (ARIMIDEX) 1 MG tablet TAKE ONE TABLET BY MOUTH ONCE DAILY Patient not taking: Reported on 01/16/2017 03/18/16   Nicholas Lose, MD  ibandronate (BONIVA) 150 MG tablet Take 1 tablet (150 mg total) by mouth every 30 (thirty) days. in am w/water on empty stomach, for 30 min-nothing by mouth & don't lie down Patient not taking: Reported on 01/16/2017 07/31/15   Nicholas Lose, MD    Family History Family History  Problem Relation Age of Onset  . Blindness Unknown   . Breast cancer Maternal Aunt   . Cancer Paternal Grandmother        unknown    Social History Social History  Substance Use Topics  . Smoking status: Never Smoker  . Smokeless tobacco: Never Used  . Alcohol use No     Allergies   Zithromax [azithromycin]   Review of Systems Review of Systems  All other systems reviewed and are negative.    Physical Exam Updated Vital Signs BP 123/61   Pulse 68  Temp 98.8 F (37.1 C)   Resp 18   Ht 5\' 5"  (1.651 m)   Wt 95.7 kg (211 lb)   SpO2 99%   BMI 35.11 kg/m   Physical Exam  Constitutional: She is oriented to person, place, and time. She appears well-developed and well-nourished. No distress.  HENT:  Head: Normocephalic and atraumatic.  Eyes: EOM are normal.  Neck: Normal range of motion.  Cardiovascular: Normal rate, regular rhythm and normal heart sounds.   Pulmonary/Chest: Effort normal and breath sounds normal.  Abdominal: Soft. She exhibits no distension.  Mild generalized abdominal tenderness without guarding or rebound  Musculoskeletal: Normal range of motion.  Full range of motion of bilateral shoulders, elbows and wrists. Full range of motion of bilateral hips, knees  and ankles.  Normal PT and DP pulses bilaterally.  No swelling of the left lower extremity as compared to right   Neurological: She is alert and oriented to person, place, and time.  Skin: Skin is warm and dry.  Psychiatric: She has a normal mood and affect. Judgment normal.  Nursing note and vitals reviewed.    ED Treatments / Results  Labs (all labs ordered are listed, but only abnormal results are displayed) Labs Reviewed  COMPREHENSIVE METABOLIC PANEL - Abnormal; Notable for the following:       Result Value   Potassium 3.4 (*)    Glucose, Bld 106 (*)    BUN 34 (*)    Creatinine, Ser 1.02 (*)    GFR calc non Af Amer 53 (*)    All other components within normal limits  CBC - Abnormal; Notable for the following:    WBC 10.6 (*)    All other components within normal limits  URINALYSIS, ROUTINE W REFLEX MICROSCOPIC - Abnormal; Notable for the following:    Hgb urine dipstick SMALL (*)    Leukocytes, UA TRACE (*)    All other components within normal limits  URINALYSIS, MICROSCOPIC (REFLEX) - Abnormal; Notable for the following:    Bacteria, UA MANY (*)    Squamous Epithelial / LPF 0-5 (*)    All other components within normal limits  LIPASE, BLOOD    EKG  EKG Interpretation None       Radiology No results found.  Procedures Procedures (including critical care time)  Medications Ordered in ED Medications  morphine 4 MG/ML injection 4 mg (4 mg Intravenous Given 01/16/17 1248)  iopamidol (ISOVUE-300) 61 % injection 100 mL (100 mLs Intravenous Contrast Given 01/16/17 1420)  cephALEXin (KEFLEX) capsule 500 mg (500 mg Oral Given 01/16/17 1607)     Initial Impression / Assessment and Plan / ED Course  I have reviewed the triage vital signs and the nursing notes.  Pertinent labs & imaging results that were available during my care of the patient were reviewed by me and considered in my medical decision making (see chart for details).     Patient is overall  well-appearing.  CT scan of her abdomen pelvis without acute pathology.  Normal pulses in bilateral lower extremities.  Doubt DVT.  Close primary care follow-up.  Patient understands return to the ER for new or worsening symptoms  Final Clinical Impressions(s) / ED Diagnoses   Final diagnoses:  Acute abdominal pain  Acute left ankle pain    New Prescriptions Discharge Medication List as of 01/16/2017  3:46 PM       Jola Schmidt, MD 01/24/17 [phone]

## 2017-03-10 ENCOUNTER — Other Ambulatory Visit: Payer: Self-pay | Admitting: Family Medicine

## 2017-03-10 DIAGNOSIS — Z853 Personal history of malignant neoplasm of breast: Secondary | ICD-10-CM

## 2017-03-31 ENCOUNTER — Ambulatory Visit
Admission: RE | Admit: 2017-03-31 | Discharge: 2017-03-31 | Disposition: A | Discharge status: Home / Self Care | Payer: PPO | Source: Ambulatory Visit | Attending: Family Medicine | Admitting: Family Medicine

## 2017-03-31 DIAGNOSIS — R922 Inconclusive mammogram: Secondary | ICD-10-CM | POA: Diagnosis not present

## 2017-03-31 DIAGNOSIS — Z853 Personal history of malignant neoplasm of breast: Secondary | ICD-10-CM

## 2017-03-31 HISTORY — DX: Personal history of irradiation: Z92.3

## 2017-06-27 DIAGNOSIS — E119 Type 2 diabetes mellitus without complications: Secondary | ICD-10-CM | POA: Diagnosis not present

## 2017-07-18 DIAGNOSIS — E119 Type 2 diabetes mellitus without complications: Secondary | ICD-10-CM | POA: Diagnosis not present

## 2017-07-18 DIAGNOSIS — I10 Essential (primary) hypertension: Secondary | ICD-10-CM | POA: Diagnosis not present

## 2017-07-18 DIAGNOSIS — E878 Other disorders of electrolyte and fluid balance, not elsewhere classified: Secondary | ICD-10-CM | POA: Diagnosis not present

## 2017-07-18 DIAGNOSIS — E782 Mixed hyperlipidemia: Secondary | ICD-10-CM | POA: Diagnosis not present

## 2017-11-17 DIAGNOSIS — H401134 Primary open-angle glaucoma, bilateral, indeterminate stage: Secondary | ICD-10-CM | POA: Diagnosis not present

## 2017-12-12 DIAGNOSIS — M79672 Pain in left foot: Secondary | ICD-10-CM | POA: Diagnosis not present

## 2018-02-06 DIAGNOSIS — Z853 Personal history of malignant neoplasm of breast: Secondary | ICD-10-CM | POA: Diagnosis not present

## 2018-02-06 DIAGNOSIS — H548 Legal blindness, as defined in USA: Secondary | ICD-10-CM | POA: Diagnosis not present

## 2018-02-06 DIAGNOSIS — M8588 Other specified disorders of bone density and structure, other site: Secondary | ICD-10-CM | POA: Diagnosis not present

## 2018-02-06 DIAGNOSIS — Z0000 Encounter for general adult medical examination without abnormal findings: Secondary | ICD-10-CM | POA: Diagnosis not present

## 2018-02-06 DIAGNOSIS — Z1159 Encounter for screening for other viral diseases: Secondary | ICD-10-CM | POA: Diagnosis not present

## 2018-02-06 DIAGNOSIS — R319 Hematuria, unspecified: Secondary | ICD-10-CM | POA: Diagnosis not present

## 2018-02-06 DIAGNOSIS — E782 Mixed hyperlipidemia: Secondary | ICD-10-CM | POA: Diagnosis not present

## 2018-02-06 DIAGNOSIS — I10 Essential (primary) hypertension: Secondary | ICD-10-CM | POA: Diagnosis not present

## 2018-02-06 DIAGNOSIS — E119 Type 2 diabetes mellitus without complications: Secondary | ICD-10-CM | POA: Diagnosis not present

## 2018-02-06 DIAGNOSIS — Z1389 Encounter for screening for other disorder: Secondary | ICD-10-CM | POA: Diagnosis not present

## 2018-02-06 DIAGNOSIS — Z6836 Body mass index (BMI) 36.0-36.9, adult: Secondary | ICD-10-CM | POA: Diagnosis not present

## 2018-02-12 DIAGNOSIS — R768 Other specified abnormal immunological findings in serum: Secondary | ICD-10-CM | POA: Diagnosis not present

## 2018-02-12 DIAGNOSIS — R319 Hematuria, unspecified: Secondary | ICD-10-CM | POA: Diagnosis not present

## 2018-04-14 ENCOUNTER — Other Ambulatory Visit: Payer: Self-pay | Admitting: Family Medicine

## 2018-04-14 DIAGNOSIS — Z853 Personal history of malignant neoplasm of breast: Secondary | ICD-10-CM

## 2018-04-14 DIAGNOSIS — M8580 Other specified disorders of bone density and structure, unspecified site: Secondary | ICD-10-CM

## 2018-05-11 ENCOUNTER — Other Ambulatory Visit: Payer: Self-pay | Admitting: Family Medicine

## 2018-05-11 ENCOUNTER — Ambulatory Visit
Admission: RE | Admit: 2018-05-11 | Discharge: 2018-05-11 | Disposition: A | Discharge status: Home / Self Care | Payer: PPO | Source: Ambulatory Visit | Attending: Family Medicine | Admitting: Family Medicine

## 2018-05-11 DIAGNOSIS — Z1231 Encounter for screening mammogram for malignant neoplasm of breast: Secondary | ICD-10-CM

## 2018-05-11 DIAGNOSIS — M8580 Other specified disorders of bone density and structure, unspecified site: Secondary | ICD-10-CM

## 2018-05-11 DIAGNOSIS — Z853 Personal history of malignant neoplasm of breast: Secondary | ICD-10-CM

## 2018-05-11 DIAGNOSIS — Z780 Asymptomatic menopausal state: Secondary | ICD-10-CM | POA: Diagnosis not present

## 2018-05-11 DIAGNOSIS — M8589 Other specified disorders of bone density and structure, multiple sites: Secondary | ICD-10-CM | POA: Diagnosis not present

## 2018-05-25 DIAGNOSIS — H2703 Aphakia, bilateral: Secondary | ICD-10-CM | POA: Diagnosis not present

## 2018-05-25 DIAGNOSIS — Q130 Coloboma of iris: Secondary | ICD-10-CM | POA: Diagnosis not present

## 2018-05-25 DIAGNOSIS — H18423 Band keratopathy, bilateral: Secondary | ICD-10-CM | POA: Diagnosis not present

## 2018-05-25 DIAGNOSIS — H401134 Primary open-angle glaucoma, bilateral, indeterminate stage: Secondary | ICD-10-CM | POA: Diagnosis not present

## 2018-05-25 DIAGNOSIS — H5500 Unspecified nystagmus: Secondary | ICD-10-CM | POA: Diagnosis not present

## 2018-05-25 DIAGNOSIS — H21563 Pupillary abnormality, bilateral: Secondary | ICD-10-CM | POA: Diagnosis not present

## 2018-08-07 DIAGNOSIS — Z6835 Body mass index (BMI) 35.0-35.9, adult: Secondary | ICD-10-CM | POA: Diagnosis not present

## 2018-08-07 DIAGNOSIS — I10 Essential (primary) hypertension: Secondary | ICD-10-CM | POA: Diagnosis not present

## 2018-08-07 DIAGNOSIS — E119 Type 2 diabetes mellitus without complications: Secondary | ICD-10-CM | POA: Diagnosis not present

## 2018-08-07 DIAGNOSIS — E782 Mixed hyperlipidemia: Secondary | ICD-10-CM | POA: Diagnosis not present

## 2018-08-07 DIAGNOSIS — H548 Legal blindness, as defined in USA: Secondary | ICD-10-CM | POA: Diagnosis not present

## 2018-08-07 DIAGNOSIS — Z853 Personal history of malignant neoplasm of breast: Secondary | ICD-10-CM | POA: Diagnosis not present

## 2018-08-25 IMAGING — CT CT CHEST W/O CM
3 of 4 series · 17 of 30 positions shown, 19 images · non-contrast
Comparison: CT scan dated 07/29/2014

CLINICAL DATA: Pulmonary nodules.

EXAM:
CT CHEST WITHOUT CONTRAST
TECHNIQUE: Multidetector CT imaging of the chest was performed following the
standard protocol without IV contrast.

[Series 3: chest w/o · axial · non-contrast · 0.80mm/px · z∈[-255,-42]mm · 6 of 119 slices shown, 8 images]
[im 17/119  mediastinal]
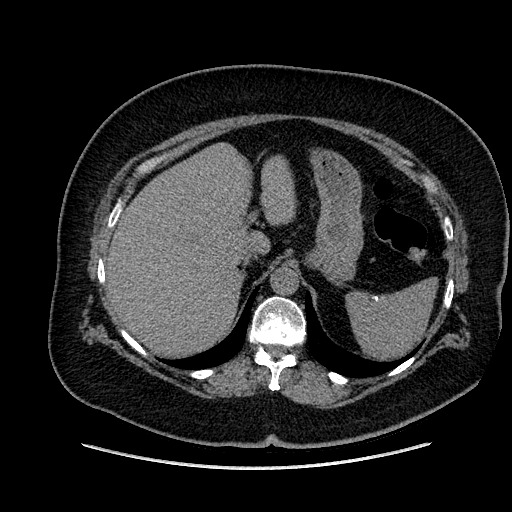
[im 17/119  lung]
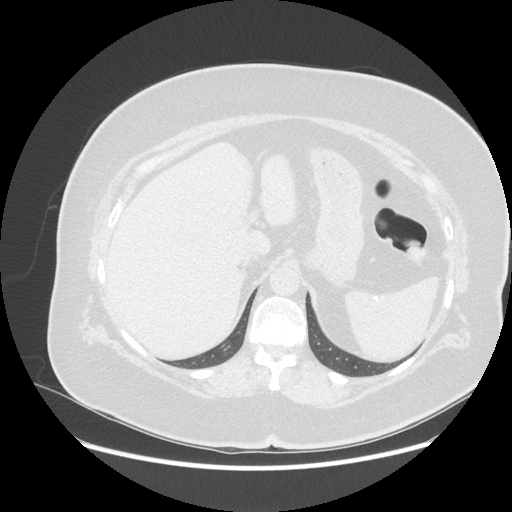
[im 34/119  lung]
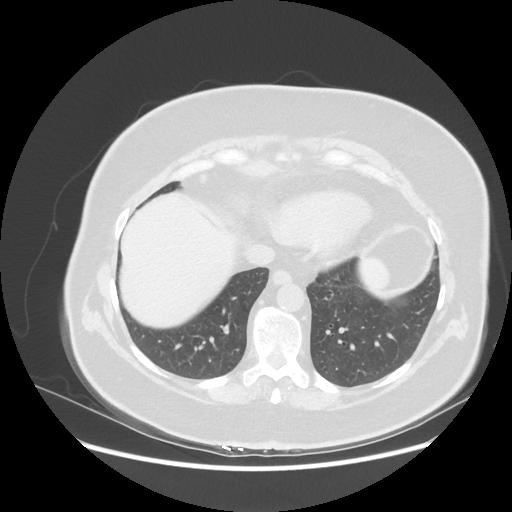
[im 51/119  lung]
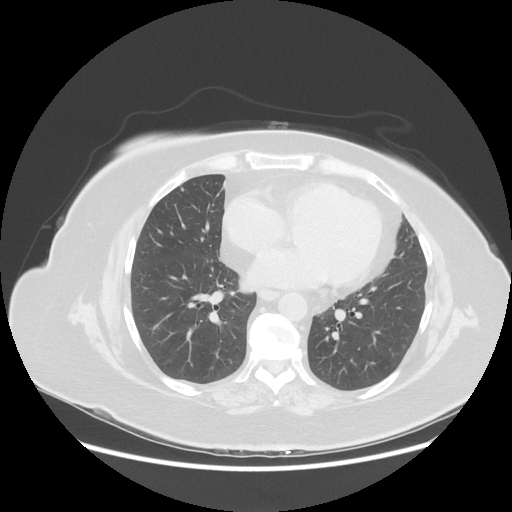
[im 68/119  lung]
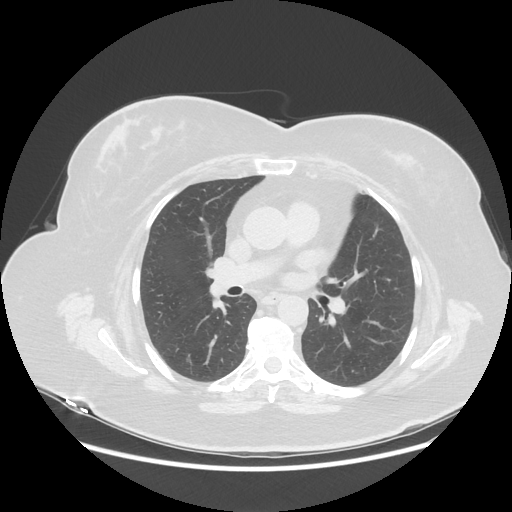
[im 85/119  mediastinal]
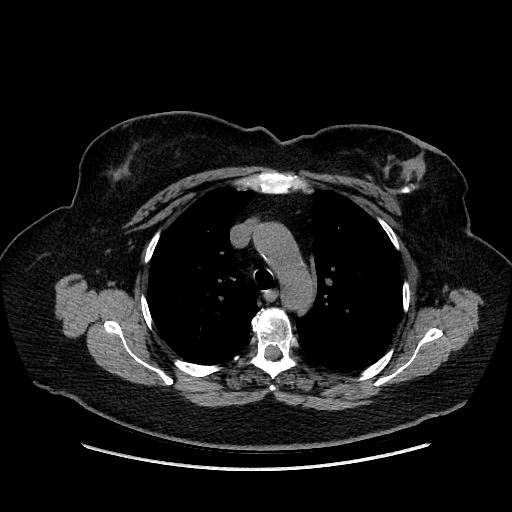
[im 85/119  lung]
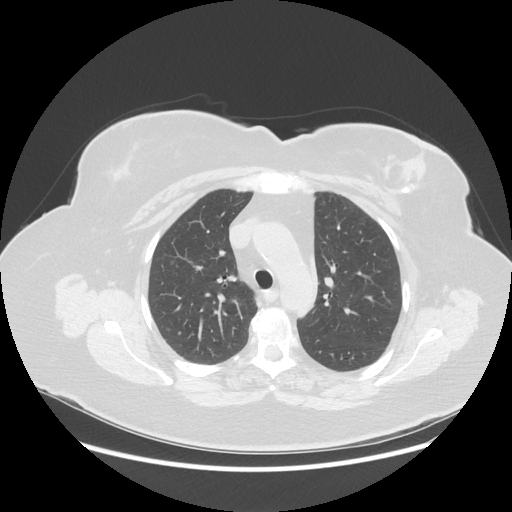
[im 102/119  lung]
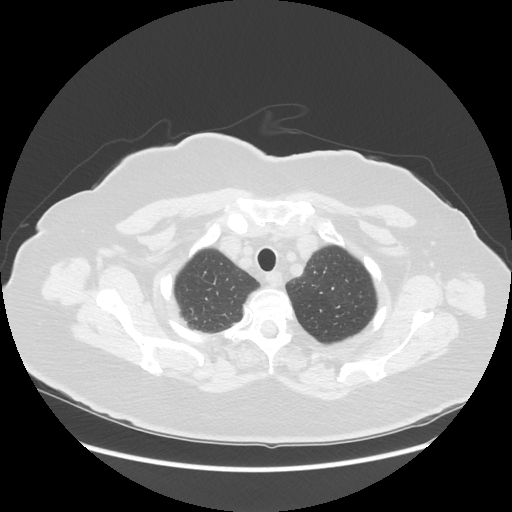

[Series 4: lung windows · axial · 0.80mm/px · z∈[-255,-42]mm · 6 of 119 slices shown]
[im 17/119  lung]
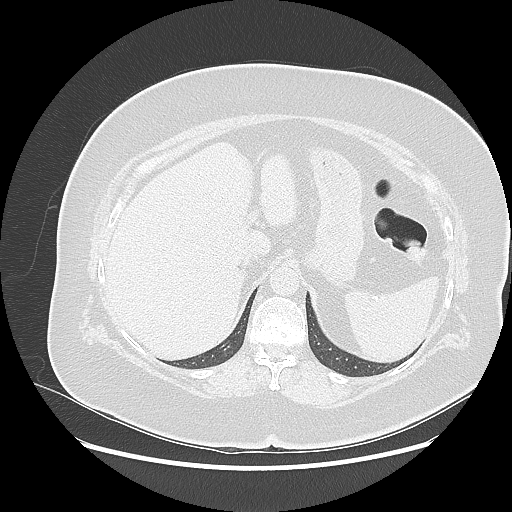
[im 34/119  lung]
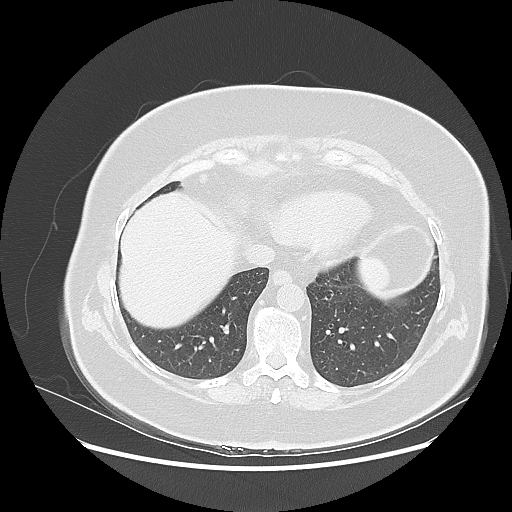
[im 51/119  lung]
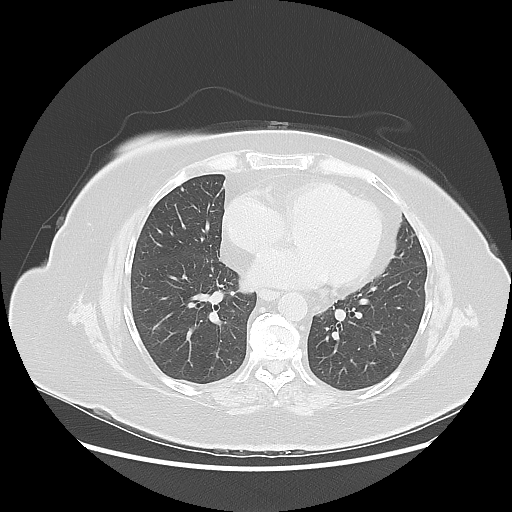
[im 68/119  lung]
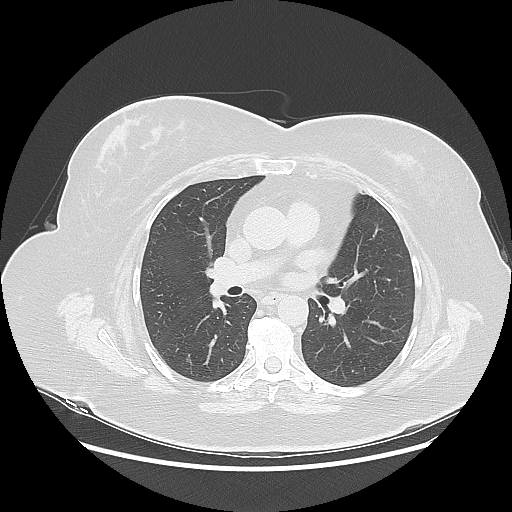
[im 85/119  lung]
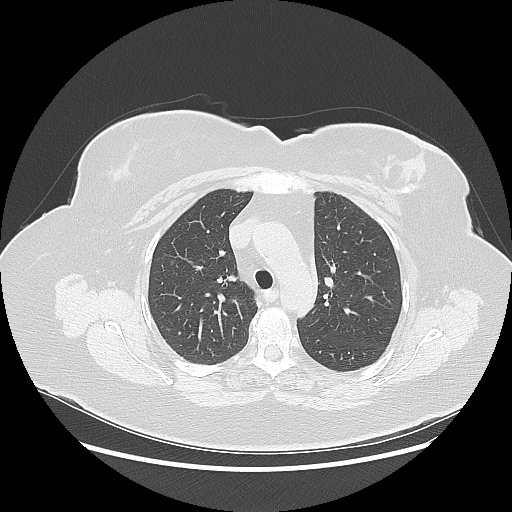
[im 102/119  lung]
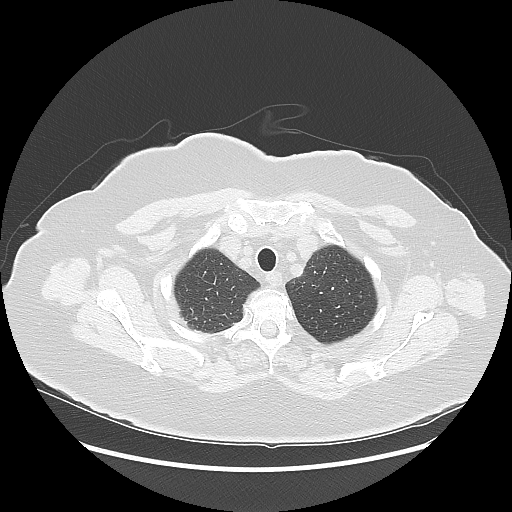

[Series 602: sagittal body · sagittal · 0.80mm/px · 5 of 166 slices shown]
[im 16/166  mediastinal]
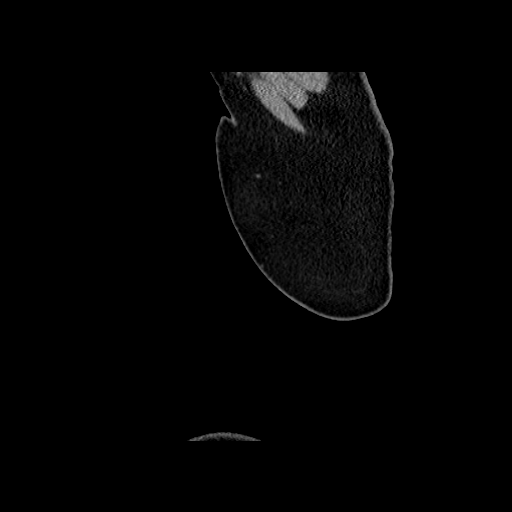
[im 31/166  mediastinal]
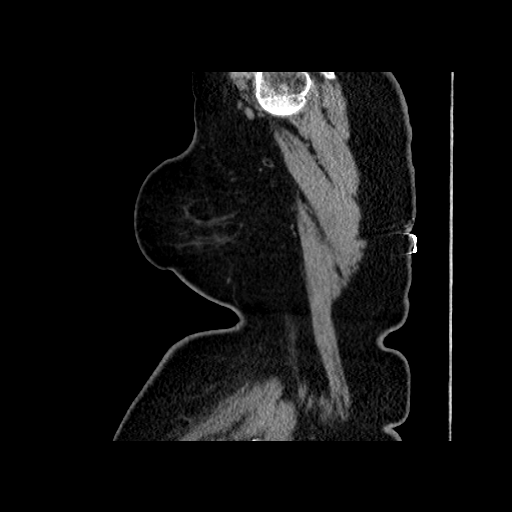
[im 61/166  mediastinal]
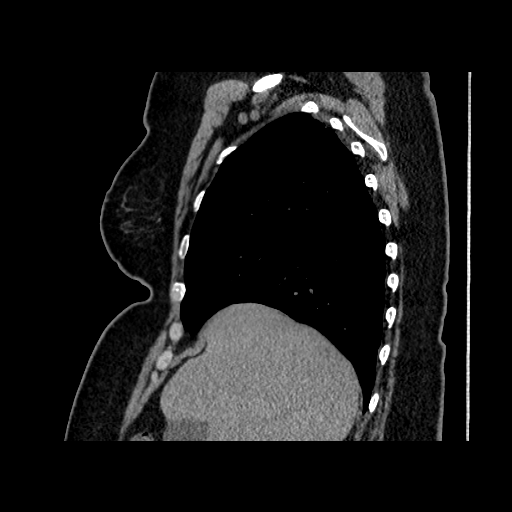
[im 76/166  mediastinal]
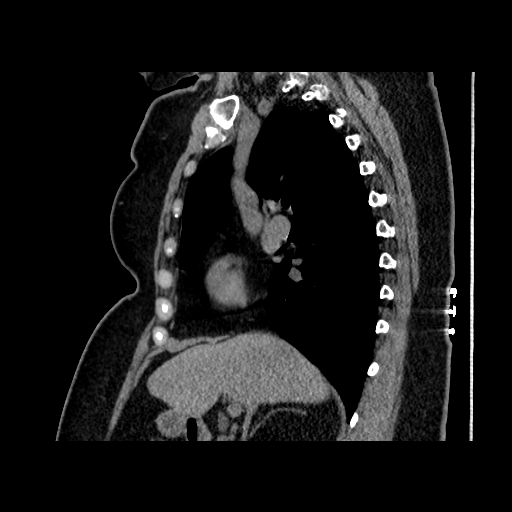
[im 91/166  mediastinal]
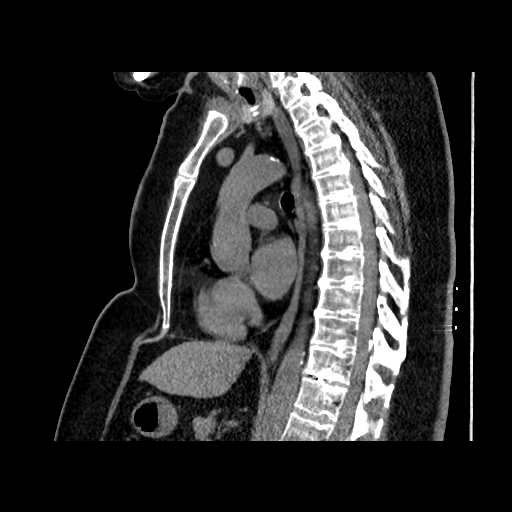

[17 of 30 positions shown; findings below may reference images not displayed]

FINDINGS: Lungs/Pleura: There has been no change in the multiple bilateral
pulmonary nodules since the prior study. On series 4 there is a 3 mm
nodule in the left upper lobe on image 40, 3.6 mm nodule on image 46
in the lower lobe and a 4 mm nodule on image 65 in the left lower
lobe. There is a 6 mm nodule in the right lower lobe on image 68.

Cardiovascular: Aortic atherosclerosis. Overall heart size is
normal. No pericardial effusion.

Mediastinum/Nodes: No enlarged mediastinal or axillary lymph nodes.
Thyroid gland, trachea, and esophagus demonstrate no significant
findings.

Upper Abdomen: Multiple new gallstones.

Musculoskeletal: No chest wall mass or suspicious bone lesions
identified.
IMPRESSION: 1. Multiple stable pulmonary nodules. No further follow-up is
recommended.
2. Aortic atherosclerosis.
3. Multiple new gallstones.

## 2018-12-07 DIAGNOSIS — H401134 Primary open-angle glaucoma, bilateral, indeterminate stage: Secondary | ICD-10-CM | POA: Diagnosis not present

## 2018-12-07 DIAGNOSIS — Q148 Other congenital malformations of posterior segment of eye: Secondary | ICD-10-CM | POA: Diagnosis not present

## 2019-03-24 DIAGNOSIS — I10 Essential (primary) hypertension: Secondary | ICD-10-CM | POA: Diagnosis not present

## 2019-03-24 DIAGNOSIS — E119 Type 2 diabetes mellitus without complications: Secondary | ICD-10-CM | POA: Diagnosis not present

## 2019-03-24 DIAGNOSIS — E782 Mixed hyperlipidemia: Secondary | ICD-10-CM | POA: Diagnosis not present

## 2019-03-24 DIAGNOSIS — E1169 Type 2 diabetes mellitus with other specified complication: Secondary | ICD-10-CM | POA: Diagnosis not present

## 2019-03-24 DIAGNOSIS — Z853 Personal history of malignant neoplasm of breast: Secondary | ICD-10-CM | POA: Diagnosis not present

## 2019-03-24 DIAGNOSIS — E7800 Pure hypercholesterolemia, unspecified: Secondary | ICD-10-CM | POA: Diagnosis not present

## 2019-04-08 ENCOUNTER — Other Ambulatory Visit: Payer: Self-pay | Admitting: Family Medicine

## 2019-04-08 DIAGNOSIS — Z1231 Encounter for screening mammogram for malignant neoplasm of breast: Secondary | ICD-10-CM

## 2019-04-23 DIAGNOSIS — Z853 Personal history of malignant neoplasm of breast: Secondary | ICD-10-CM | POA: Diagnosis not present

## 2019-04-23 DIAGNOSIS — Z0000 Encounter for general adult medical examination without abnormal findings: Secondary | ICD-10-CM | POA: Diagnosis not present

## 2019-04-23 DIAGNOSIS — H548 Legal blindness, as defined in USA: Secondary | ICD-10-CM | POA: Diagnosis not present

## 2019-04-23 DIAGNOSIS — E1169 Type 2 diabetes mellitus with other specified complication: Secondary | ICD-10-CM | POA: Diagnosis not present

## 2019-04-23 DIAGNOSIS — E119 Type 2 diabetes mellitus without complications: Secondary | ICD-10-CM | POA: Diagnosis not present

## 2019-04-23 DIAGNOSIS — E782 Mixed hyperlipidemia: Secondary | ICD-10-CM | POA: Diagnosis not present

## 2019-04-23 DIAGNOSIS — I10 Essential (primary) hypertension: Secondary | ICD-10-CM | POA: Diagnosis not present

## 2019-05-27 DIAGNOSIS — E1169 Type 2 diabetes mellitus with other specified complication: Secondary | ICD-10-CM | POA: Diagnosis not present

## 2019-05-27 DIAGNOSIS — Z853 Personal history of malignant neoplasm of breast: Secondary | ICD-10-CM | POA: Diagnosis not present

## 2019-05-27 DIAGNOSIS — E119 Type 2 diabetes mellitus without complications: Secondary | ICD-10-CM | POA: Diagnosis not present

## 2019-05-27 DIAGNOSIS — E7800 Pure hypercholesterolemia, unspecified: Secondary | ICD-10-CM | POA: Diagnosis not present

## 2019-05-27 DIAGNOSIS — E782 Mixed hyperlipidemia: Secondary | ICD-10-CM | POA: Diagnosis not present

## 2019-05-27 DIAGNOSIS — I10 Essential (primary) hypertension: Secondary | ICD-10-CM | POA: Diagnosis not present

## 2019-06-09 ENCOUNTER — Ambulatory Visit: Payer: PPO

## 2019-06-16 DIAGNOSIS — H401134 Primary open-angle glaucoma, bilateral, indeterminate stage: Secondary | ICD-10-CM | POA: Diagnosis not present

## 2019-06-24 DIAGNOSIS — Z853 Personal history of malignant neoplasm of breast: Secondary | ICD-10-CM | POA: Diagnosis not present

## 2019-06-24 DIAGNOSIS — E1169 Type 2 diabetes mellitus with other specified complication: Secondary | ICD-10-CM | POA: Diagnosis not present

## 2019-06-24 DIAGNOSIS — E782 Mixed hyperlipidemia: Secondary | ICD-10-CM | POA: Diagnosis not present

## 2019-06-24 DIAGNOSIS — I10 Essential (primary) hypertension: Secondary | ICD-10-CM | POA: Diagnosis not present

## 2019-06-24 DIAGNOSIS — E7800 Pure hypercholesterolemia, unspecified: Secondary | ICD-10-CM | POA: Diagnosis not present

## 2019-06-24 DIAGNOSIS — E119 Type 2 diabetes mellitus without complications: Secondary | ICD-10-CM | POA: Diagnosis not present

## 2019-07-16 ENCOUNTER — Ambulatory Visit: Payer: PPO | Attending: Internal Medicine

## 2019-07-16 ENCOUNTER — Other Ambulatory Visit: Payer: Self-pay

## 2019-07-16 DIAGNOSIS — Z23 Encounter for immunization: Secondary | ICD-10-CM | POA: Insufficient documentation

## 2019-07-16 NOTE — Progress Notes (Signed)
   Covid-19 Vaccination Clinic  Name:  [name]    MRN: [id number] DOB: [birth year]  07/16/2019  [name] was observed post Covid-19 immunization for 15 minutes without incidence. She was provided with Vaccine Information Sheet and instruction to access the V-Safe system.   [name] was instructed to call 911 with any severe reactions post vaccine: Marland Kitchen Difficulty breathing  . Swelling of your face and throat  . A fast heartbeat  . A bad rash all over your body  . Dizziness and weakness    Immunizations Administered    Name Date Dose VIS Date Route   Pfizer COVID-19 Vaccine 07/16/2019  4:24 PM 0.3 mL 04/30/2019 Intramuscular   Manufacturer: [place]   Lot: [id number]   [place]: [id number]

## 2019-07-17 DIAGNOSIS — R079 Chest pain, unspecified: Secondary | ICD-10-CM | POA: Diagnosis not present

## 2019-07-17 DIAGNOSIS — R070 Pain in throat: Secondary | ICD-10-CM | POA: Diagnosis not present

## 2019-07-17 DIAGNOSIS — J80 Acute respiratory distress syndrome: Secondary | ICD-10-CM | POA: Diagnosis not present

## 2019-07-17 DIAGNOSIS — R0789 Other chest pain: Secondary | ICD-10-CM | POA: Diagnosis not present

## 2019-07-19 DIAGNOSIS — 419620001 Death: Secondary | SNOMED CT | POA: Diagnosis not present

## 2019-07-19 DEATH — deceased

## 2019-07-23 DIAGNOSIS — E119 Type 2 diabetes mellitus without complications: Secondary | ICD-10-CM | POA: Diagnosis not present

## 2019-07-23 DIAGNOSIS — H548 Legal blindness, as defined in USA: Secondary | ICD-10-CM | POA: Diagnosis not present

## 2019-07-23 DIAGNOSIS — E7800 Pure hypercholesterolemia, unspecified: Secondary | ICD-10-CM | POA: Diagnosis not present

## 2019-07-23 DIAGNOSIS — I10 Essential (primary) hypertension: Secondary | ICD-10-CM | POA: Diagnosis not present

## 2019-07-23 DIAGNOSIS — Z853 Personal history of malignant neoplasm of breast: Secondary | ICD-10-CM | POA: Diagnosis not present

## 2019-07-23 DIAGNOSIS — R739 Hyperglycemia, unspecified: Secondary | ICD-10-CM | POA: Diagnosis not present

## 2019-07-23 DIAGNOSIS — E782 Mixed hyperlipidemia: Secondary | ICD-10-CM | POA: Diagnosis not present

## 2019-07-23 DIAGNOSIS — Z0000 Encounter for general adult medical examination without abnormal findings: Secondary | ICD-10-CM | POA: Diagnosis not present

## 2019-07-23 DIAGNOSIS — E1169 Type 2 diabetes mellitus with other specified complication: Secondary | ICD-10-CM | POA: Diagnosis not present

## 2019-08-05 ENCOUNTER — Ambulatory Visit: Payer: PPO

## 2019-08-11 ENCOUNTER — Ambulatory Visit: Payer: PPO | Attending: Internal Medicine

## 2019-08-11 DIAGNOSIS — Z23 Encounter for immunization: Secondary | ICD-10-CM

## 2019-08-11 NOTE — Progress Notes (Signed)
   Covid-19 Vaccination Clinic  Name:  DAYLYNN BERGSTRAND    MRN: [id number] DOB: July 25, 1945  08/11/2019  Ms. Lapaglia was observed post Covid-19 immunization for 15 minutes without incident. She was provided with Vaccine Information Sheet and instruction to access the V-Safe system.   Ms. Krolikowski was instructed to call 911 with any severe reactions post vaccine: Marland Kitchen Difficulty breathing  . Swelling of face and throat  . A fast heartbeat  . A bad rash all over body  . Dizziness and weakness   Immunizations Administered    Name Date Dose VIS Date Route   Pfizer COVID-19 Vaccine 08/11/2019  4:45 PM 0.3 mL 04/30/2019 Intramuscular   Manufacturer: [place]   Lot: [id number]   [place]: [id number]

## 2019-08-26 ENCOUNTER — Ambulatory Visit: Payer: PPO

## 2019-09-20 ENCOUNTER — Ambulatory Visit
Admission: RE | Admit: 2019-09-20 | Discharge: 2019-09-20 | Disposition: A | Discharge status: Home / Self Care | Payer: PPO | Source: Ambulatory Visit | Attending: Family Medicine | Admitting: Family Medicine

## 2019-09-20 ENCOUNTER — Other Ambulatory Visit: Payer: Self-pay

## 2019-09-20 DIAGNOSIS — Z1231 Encounter for screening mammogram for malignant neoplasm of breast: Secondary | ICD-10-CM | POA: Diagnosis not present

## 2019-09-21 ENCOUNTER — Other Ambulatory Visit: Payer: Self-pay | Admitting: Family Medicine

## 2019-09-21 DIAGNOSIS — Z853 Personal history of malignant neoplasm of breast: Secondary | ICD-10-CM | POA: Diagnosis not present

## 2019-09-21 DIAGNOSIS — E119 Type 2 diabetes mellitus without complications: Secondary | ICD-10-CM | POA: Diagnosis not present

## 2019-09-21 DIAGNOSIS — E7800 Pure hypercholesterolemia, unspecified: Secondary | ICD-10-CM | POA: Diagnosis not present

## 2019-09-21 DIAGNOSIS — E782 Mixed hyperlipidemia: Secondary | ICD-10-CM | POA: Diagnosis not present

## 2019-09-21 DIAGNOSIS — I10 Essential (primary) hypertension: Secondary | ICD-10-CM | POA: Diagnosis not present

## 2019-09-21 DIAGNOSIS — R928 Other abnormal and inconclusive findings on diagnostic imaging of breast: Secondary | ICD-10-CM

## 2019-09-21 DIAGNOSIS — E1169 Type 2 diabetes mellitus with other specified complication: Secondary | ICD-10-CM | POA: Diagnosis not present

## 2019-09-24 ENCOUNTER — Other Ambulatory Visit: Payer: Self-pay

## 2019-09-24 ENCOUNTER — Ambulatory Visit
Admission: RE | Admit: 2019-09-24 | Discharge: 2019-09-24 | Disposition: A | Discharge status: Home / Self Care | Payer: PPO | Source: Ambulatory Visit | Attending: Family Medicine | Admitting: Family Medicine

## 2019-09-24 ENCOUNTER — Other Ambulatory Visit: Payer: Self-pay | Admitting: Family Medicine

## 2019-09-24 DIAGNOSIS — N6320 Unspecified lump in the left breast, unspecified quadrant: Secondary | ICD-10-CM | POA: Diagnosis not present

## 2019-09-24 DIAGNOSIS — Z853 Personal history of malignant neoplasm of breast: Secondary | ICD-10-CM | POA: Diagnosis not present

## 2019-09-24 DIAGNOSIS — R928 Other abnormal and inconclusive findings on diagnostic imaging of breast: Secondary | ICD-10-CM

## 2019-09-24 DIAGNOSIS — R922 Inconclusive mammogram: Secondary | ICD-10-CM | POA: Diagnosis not present

## 2019-10-08 ENCOUNTER — Other Ambulatory Visit: Payer: Self-pay

## 2019-10-08 ENCOUNTER — Ambulatory Visit
Admission: RE | Admit: 2019-10-08 | Discharge: 2019-10-08 | Disposition: A | Discharge status: Home / Self Care | Payer: PPO | Source: Ambulatory Visit | Attending: Family Medicine | Admitting: Family Medicine

## 2019-10-08 ENCOUNTER — Other Ambulatory Visit: Payer: Self-pay | Admitting: Family Medicine

## 2019-10-08 DIAGNOSIS — R928 Other abnormal and inconclusive findings on diagnostic imaging of breast: Secondary | ICD-10-CM

## 2019-10-08 DIAGNOSIS — N6489 Other specified disorders of breast: Secondary | ICD-10-CM

## 2019-12-20 DIAGNOSIS — H2703 Aphakia, bilateral: Secondary | ICD-10-CM | POA: Diagnosis not present

## 2019-12-20 DIAGNOSIS — H18423 Band keratopathy, bilateral: Secondary | ICD-10-CM | POA: Diagnosis not present

## 2019-12-20 DIAGNOSIS — Q148 Other congenital malformations of posterior segment of eye: Secondary | ICD-10-CM | POA: Diagnosis not present

## 2019-12-20 DIAGNOSIS — H401134 Primary open-angle glaucoma, bilateral, indeterminate stage: Secondary | ICD-10-CM | POA: Diagnosis not present

## 2020-04-07 ENCOUNTER — Ambulatory Visit
Admission: RE | Admit: 2020-04-07 | Discharge: 2020-04-07 | Disposition: A | Discharge status: Home / Self Care | Payer: PPO | Source: Ambulatory Visit | Attending: Family Medicine | Admitting: Family Medicine

## 2020-04-07 ENCOUNTER — Other Ambulatory Visit: Payer: Self-pay

## 2020-04-07 ENCOUNTER — Other Ambulatory Visit: Payer: Self-pay | Admitting: Family Medicine

## 2020-04-07 DIAGNOSIS — N6489 Other specified disorders of breast: Secondary | ICD-10-CM | POA: Diagnosis not present

## 2020-04-28 DIAGNOSIS — M859 Disorder of bone density and structure, unspecified: Secondary | ICD-10-CM | POA: Diagnosis not present

## 2020-04-28 DIAGNOSIS — E782 Mixed hyperlipidemia: Secondary | ICD-10-CM | POA: Diagnosis not present

## 2020-04-28 DIAGNOSIS — R1011 Right upper quadrant pain: Secondary | ICD-10-CM | POA: Diagnosis not present

## 2020-04-28 DIAGNOSIS — H4089 Other specified glaucoma: Secondary | ICD-10-CM | POA: Diagnosis not present

## 2020-04-28 DIAGNOSIS — H548 Legal blindness, as defined in USA: Secondary | ICD-10-CM | POA: Diagnosis not present

## 2020-04-28 DIAGNOSIS — N183 Chronic kidney disease, stage 3 unspecified: Secondary | ICD-10-CM | POA: Diagnosis not present

## 2020-04-28 DIAGNOSIS — Z0000 Encounter for general adult medical examination without abnormal findings: Secondary | ICD-10-CM | POA: Diagnosis not present

## 2020-04-28 DIAGNOSIS — Z853 Personal history of malignant neoplasm of breast: Secondary | ICD-10-CM | POA: Diagnosis not present

## 2020-04-28 DIAGNOSIS — I700 Atherosclerosis of aorta: Secondary | ICD-10-CM | POA: Diagnosis not present

## 2020-04-28 DIAGNOSIS — E1122 Type 2 diabetes mellitus with diabetic chronic kidney disease: Secondary | ICD-10-CM | POA: Diagnosis not present

## 2020-04-28 DIAGNOSIS — I10 Essential (primary) hypertension: Secondary | ICD-10-CM | POA: Diagnosis not present

## 2020-04-28 DIAGNOSIS — Z6835 Body mass index (BMI) 35.0-35.9, adult: Secondary | ICD-10-CM | POA: Diagnosis not present

## 2020-06-02 ENCOUNTER — Ambulatory Visit: Payer: Self-pay | Admitting: General Surgery

## 2020-06-02 DIAGNOSIS — E1169 Type 2 diabetes mellitus with other specified complication: Secondary | ICD-10-CM | POA: Diagnosis not present

## 2020-06-02 DIAGNOSIS — I10 Essential (primary) hypertension: Secondary | ICD-10-CM | POA: Diagnosis not present

## 2020-06-02 DIAGNOSIS — K8020 Calculus of gallbladder without cholecystitis without obstruction: Secondary | ICD-10-CM | POA: Diagnosis not present

## 2020-06-02 DIAGNOSIS — E669 Obesity, unspecified: Secondary | ICD-10-CM | POA: Diagnosis not present

## 2020-06-02 NOTE — H&P (Signed)
[name] Appointment: 06/02/2020 3:00 PM Location: [facility] Patient #: [id number] DOB: 06/02/1945 Widowed / Language: [name] / Race: White Female  History of Present Illness Randall Hiss M. Nasire Reali MD; 06/02/2020 4:12 PM) The patient is a 75 year old female who presents for evaluation of gall stones. She is referred by Dr. Sabra Heck for evaluation of right upper quadrant pain. She is accompanied by her son. Past medical history includes hypertension, remote history of breast cancer, hyperlipidemia and obesity. She states she has 3 or 4 episodes a week where she will have acute onset of right upper quadrant pain. It lasts for about 10 minutes. It's random. It does not really radiate. No nausea or vomiting her melena hematochezia. Daily bowel movements. Only prior surgery is a C-section. She had a CT scan from 2018 that showed gallstones. She has remote history of breast cancer. 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 She also has diabetes however her last A1c was 6.5. I reviewed labs and the office noted that her PCP sent over. LFTs and CBC were normal. She denies any chest pain, chest pressure, source of breath at rest. May be some dyspnea on exertion. No TIAs or amaurosis fugax. No blood thinners.   Problem List/Past Medical Randall Hiss M. Redmond Pulling, MD; 06/02/2020 4:12 PM) HISTORY OF LEFT BREAST CANCER (Z85.3) SYMPTOMATIC CHOLELITHIASIS (K80.20)  Past Surgical History (Kheana Marshall-McBride, CMA; 06/02/2020 3:15 PM) Breast Biopsy Left. Breast Mass; Local Excision Left. Cataract Surgery Bilateral. Cesarean Section - Multiple Hysterectomy (not due to cancer) - Partial Tonsillectomy  Diagnostic Studies History Randall Hiss M. Redmond Pulling, MD; 06/02/2020 4:12 PM) Colonoscopy never Mammogram within last year Pap Smear >5 years ago  Allergies Sherron Ales Marshall-McBride, CMA; 06/02/2020 3:16 PM) Zithromax  *MACROLIDES* Allergies Reconciled  Medication History (Kheana Marshall-McBride, CMA; 06/02/2020 3:16 PM) Tylenol Extra Strength (1000MG /30ML Liquid, Oral) Active. Calcium 600 (1500MG  Tablet, Oral) Active. Boniva (150MG  Tablet, Oral) Active. Latanoprost (Ophthalmic as needed) Active. Lisinopril-Hydrochlorothiazide (20-12.5MG  Tablet, Oral) Active. Lovastatin (20MG  Tablet, Oral) Active. Metoprolol Tartrate (50MG  Tablet, Oral) Active. Timolol Maleate (0.5% Solution, Ophthalmic) Active. Medications Reconciled  Social History Hortencia Conradi, CMA; 06/02/2020 3:15 PM) Caffeine use Carbonated beverages, Coffee, Tea. No alcohol use No drug use Tobacco use Never smoker.  Family History Randall Hiss M. Redmond Pulling, MD; 06/02/2020 4:12 PM) Arthritis Father, Mother. Breast Cancer Family Members In General. Cerebrovascular Accident Father, Mother. Diabetes Mellitus Mother. Heart Disease Mother. Heart disease in female family member before age 53 Hypertension Father, Mother. Arthritis Mother.  Pregnancy / Birth History Randall Hiss M. Redmond Pulling, MD; 06/02/2020 4:12 PM) Age at menarche 75 years, 13 years. Age of menopause 54-50 Gravida 3 Maternal age 61-40 Para 2 Contraceptive History Oral contraceptives. Irregular periods  Other Problems Randall Hiss M. Redmond Pulling, MD; 06/02/2020 4:12 PM) Breast Cancer Cholelithiasis Hypercholesterolemia Lump In Breast High blood pressure Diabetes Mellitus     Review of Systems (Kheana Marshall-McBride CMA; 06/02/2020 3:15 PM) Skin Present- Dryness. Not Present- Change in Wart/Mole, Hives, Jaundice, New Lesions, Non-Healing Wounds, Rash and Ulcer. HEENT Present- Hearing Loss, Visual Disturbances and Wears glasses/contact lenses. Not Present- Earache, Hoarseness, Nose Bleed, Oral Ulcers, Ringing in the Ears, Seasonal Allergies, Sinus Pain, Sore Throat and Yellow Eyes. Respiratory Present- Snoring. Not Present- Bloody sputum, Chronic Cough,  Difficulty Breathing and Wheezing. Breast Present- Breast Mass. Not Present- Breast Pain, Nipple Discharge and Skin Changes. Female Genitourinary Present- Frequency and Nocturia. Not Present- Painful Urination, Pelvic Pain and Urgency. Neurological Not Present- Decreased Memory, Fainting,  Headaches, Numbness, Seizures, Tingling, Tremor, Trouble walking and Weakness. Psychiatric Not Present- Anxiety, Bipolar, Change in Sleep Pattern, Depression, Fearful and Frequent crying. Endocrine Not Present- Cold Intolerance, Excessive Hunger, Hair Changes, Heat Intolerance, Hot flashes and New Diabetes. Hematology Not Present- Blood Thinners, Easy Bruising, Excessive bleeding, Gland problems, HIV and Persistent Infections.  Vitals (Kheana Marshall-McBride CMA; 06/02/2020 3:17 PM) 06/02/2020 3:16 PM Weight: 204 lb Height: 65in Body Surface Area: 1.99 m Body Mass Index: 33.95 kg/m  Temp.: 97.85F  Pulse: 84 (Regular)  P.OX: 96% (Room air) BP: 154/84(Sitting, Left Arm, Small)        Physical Exam Randall Hiss M. Shadana Pry MD; 06/02/2020 4:11 PM)  The physical exam findings are as follows: Note:Central truncal obesity  General Mental Status-Alert. General Appearance-Consistent with stated age. Hydration-Well hydrated. Voice-Normal.  Head and Neck Head-normocephalic, atraumatic with no lesions or palpable masses. Trachea-midline. Thyroid Gland Characteristics - normal size and consistency. Note: Thinning hair  Eye Eyeball - Bilateral-Extraocular movements intact. Sclera/Conjunctiva - Bilateral-No scleral icterus.  Chest and Lung Exam Chest and lung exam reveals -quiet, even and easy respiratory effort with no use of accessory muscles and on auscultation, normal breath sounds, no adventitious sounds and normal vocal resonance. Inspection Chest Wall - Normal. Back - normal.  Breast - Did not examine.  Cardiovascular Cardiovascular examination reveals -normal  heart sounds, regular rate and rhythm with no murmurs and normal pedal pulses bilaterally.  Abdomen Inspection Inspection of the abdomen reveals - No Hernias. Skin - Scar - no surgical scars. Palpation/Percussion Palpation and Percussion of the abdomen reveal - Soft, Non Tender, No Rebound tenderness, No Rigidity (guarding) and No hepatosplenomegaly. Auscultation Auscultation of the abdomen reveals - Bowel sounds normal.  Peripheral Vascular Upper Extremity Palpation - Pulses bilaterally normal.  Neurologic Neurologic evaluation reveals -alert and oriented x 3 with no impairment of recent or remote memory. Mental Status-Normal.  Neuropsychiatric The patient's mood and affect are described as -normal. Judgment and Insight-insight is appropriate concerning matters relevant to self.  Musculoskeletal Normal Exam - Left-Upper Extremity Strength Normal and Lower Extremity Strength Normal. Normal Exam - Right-Upper Extremity Strength Normal and Lower Extremity Strength Normal.  Lymphatic Head & Neck  General Head & Neck Lymphatics: Bilateral - Description - Normal. Axillary - Did not examine. Femoral & Inguinal - Did not examine.    Assessment & Plan Randall Hiss M. Jaidynn Balster MD; 06/02/2020 4:11 PM)  SYMPTOMATIC CHOLELITHIASIS (K80.20) Impression: I believe the patient's symptoms are consistent with gallbladder disease.  We discussed gallbladder disease. The patient was given Neurosurgeon. We discussed non-operative and operative management. We discussed the signs & symptoms of acute cholecystitis    I discussed laparoscopic cholecystectomy with IOC in detail. The patient was given educational material as well as diagrams detailing the procedure. We discussed the risks and benefits of a laparoscopic cholecystectomy including, but not limited to bleeding, infection, injury to surrounding structures such as the intestine or liver, bile leak, retained gallstones, need to  convert to an open procedure, prolonged diarrhea, blood clots such as DVT, common bile duct injury, anesthesia risks, and possible need for additional procedures. We discussed the typical post-operative recovery course. I explained that the likelihood of improvement of their symptoms is good.  The patient has elected to proceed with surgery once we receive medical clearance from her pcp  On a side note I do not see any radiological evidence of a splenic artery aneurysm on prior CT imaging from 2018 as well as 2011 or/2010  This patient encounter  took 33 minutes today to perform the following: take history, perform exam, review outside records, interpret imaging, counsel the patient on their diagnosis and document encounter, findings & plan in the EHR  Current Plans Pt Education - Pamphlet Given - Laparoscopic Gallbladder Surgery: discussed with patient and provided information. You are being scheduled for surgery- Our schedulers will call you.  You should hear from our office's scheduling department within 5 working days about the location, date, and time of surgery. We try to make accommodations for patient's preferences in scheduling surgery, but sometimes the OR schedule or the surgeon's schedule prevents [place] from making those accommodations.  If you have not heard from our office [phone]) in 5 working days, call the office and ask for your surgeon's nurse.  If you have other questions about your diagnosis, plan, or surgery, call the office and ask for your surgeon's nurse.   PRIMARY HYPERTENSION (I10)   DIABETES MELLITUS TYPE 2 IN OBESE (E11.69) Impression: a1c 6.5  Leighton Ruff. Redmond Pulling, MD, FACS General, Bariatric, & Minimally Invasive Surgery [facility], [place]

## 2020-08-11 DIAGNOSIS — H2703 Aphakia, bilateral: Secondary | ICD-10-CM | POA: Diagnosis not present

## 2020-08-11 DIAGNOSIS — H401134 Primary open-angle glaucoma, bilateral, indeterminate stage: Secondary | ICD-10-CM | POA: Diagnosis not present

## 2020-10-13 DIAGNOSIS — E782 Mixed hyperlipidemia: Secondary | ICD-10-CM | POA: Diagnosis not present

## 2020-10-13 DIAGNOSIS — K8020 Calculus of gallbladder without cholecystitis without obstruction: Secondary | ICD-10-CM | POA: Diagnosis not present

## 2020-10-13 DIAGNOSIS — E1122 Type 2 diabetes mellitus with diabetic chronic kidney disease: Secondary | ICD-10-CM | POA: Diagnosis not present

## 2020-10-13 DIAGNOSIS — Z6835 Body mass index (BMI) 35.0-35.9, adult: Secondary | ICD-10-CM | POA: Diagnosis not present

## 2020-10-13 DIAGNOSIS — I700 Atherosclerosis of aorta: Secondary | ICD-10-CM | POA: Diagnosis not present

## 2020-10-13 DIAGNOSIS — I10 Essential (primary) hypertension: Secondary | ICD-10-CM | POA: Diagnosis not present

## 2020-10-26 ENCOUNTER — Other Ambulatory Visit: Payer: Self-pay | Admitting: Family Medicine

## 2020-10-26 ENCOUNTER — Other Ambulatory Visit: Payer: Self-pay | Admitting: *Deleted

## 2020-10-26 DIAGNOSIS — N6489 Other specified disorders of breast: Secondary | ICD-10-CM

## 2020-11-24 ENCOUNTER — Ambulatory Visit
Admission: RE | Admit: 2020-11-24 | Discharge: 2020-11-24 | Disposition: A | Discharge status: Home / Self Care | Payer: PPO | Source: Ambulatory Visit | Attending: Family Medicine | Admitting: Family Medicine

## 2020-11-24 ENCOUNTER — Other Ambulatory Visit: Payer: Self-pay

## 2020-11-24 DIAGNOSIS — N6489 Other specified disorders of breast: Secondary | ICD-10-CM

## 2020-11-24 DIAGNOSIS — R922 Inconclusive mammogram: Secondary | ICD-10-CM | POA: Diagnosis not present

## 2020-11-24 DIAGNOSIS — Z853 Personal history of malignant neoplasm of breast: Secondary | ICD-10-CM | POA: Diagnosis not present

## 2021-02-02 DIAGNOSIS — H401134 Primary open-angle glaucoma, bilateral, indeterminate stage: Secondary | ICD-10-CM | POA: Diagnosis not present

## 2021-02-02 DIAGNOSIS — H2703 Aphakia, bilateral: Secondary | ICD-10-CM | POA: Diagnosis not present

## 2021-02-02 DIAGNOSIS — H18423 Band keratopathy, bilateral: Secondary | ICD-10-CM | POA: Diagnosis not present

## 2021-07-23 DIAGNOSIS — I700 Atherosclerosis of aorta: Secondary | ICD-10-CM | POA: Diagnosis not present

## 2021-07-23 DIAGNOSIS — I10 Essential (primary) hypertension: Secondary | ICD-10-CM | POA: Diagnosis not present

## 2021-07-23 DIAGNOSIS — Z853 Personal history of malignant neoplasm of breast: Secondary | ICD-10-CM | POA: Diagnosis not present

## 2021-07-23 DIAGNOSIS — H548 Legal blindness, as defined in USA: Secondary | ICD-10-CM | POA: Diagnosis not present

## 2021-07-23 DIAGNOSIS — N183 Chronic kidney disease, stage 3 unspecified: Secondary | ICD-10-CM | POA: Diagnosis not present

## 2021-07-23 DIAGNOSIS — H4089 Other specified glaucoma: Secondary | ICD-10-CM | POA: Diagnosis not present

## 2021-07-23 DIAGNOSIS — Z6833 Body mass index (BMI) 33.0-33.9, adult: Secondary | ICD-10-CM | POA: Diagnosis not present

## 2021-07-23 DIAGNOSIS — M8588 Other specified disorders of bone density and structure, other site: Secondary | ICD-10-CM | POA: Diagnosis not present

## 2021-07-23 DIAGNOSIS — E782 Mixed hyperlipidemia: Secondary | ICD-10-CM | POA: Diagnosis not present

## 2021-07-23 DIAGNOSIS — E1122 Type 2 diabetes mellitus with diabetic chronic kidney disease: Secondary | ICD-10-CM | POA: Diagnosis not present

## 2021-08-06 DIAGNOSIS — H2703 Aphakia, bilateral: Secondary | ICD-10-CM | POA: Diagnosis not present

## 2021-08-06 DIAGNOSIS — H18423 Band keratopathy, bilateral: Secondary | ICD-10-CM | POA: Diagnosis not present

## 2021-08-06 DIAGNOSIS — H401134 Primary open-angle glaucoma, bilateral, indeterminate stage: Secondary | ICD-10-CM | POA: Diagnosis not present

## 2021-10-01 DIAGNOSIS — H401134 Primary open-angle glaucoma, bilateral, indeterminate stage: Secondary | ICD-10-CM | POA: Diagnosis not present

## 2021-11-08 ENCOUNTER — Other Ambulatory Visit: Payer: Self-pay | Admitting: Family

## 2021-11-08 ENCOUNTER — Other Ambulatory Visit: Payer: Self-pay | Admitting: Family Medicine

## 2021-11-08 DIAGNOSIS — Z1231 Encounter for screening mammogram for malignant neoplasm of breast: Secondary | ICD-10-CM

## 2021-11-26 ENCOUNTER — Ambulatory Visit
Admission: RE | Admit: 2021-11-26 | Discharge: 2021-11-26 | Disposition: A | Discharge status: Home / Self Care | Payer: PPO | Source: Ambulatory Visit | Attending: Family Medicine | Admitting: Family Medicine

## 2021-11-26 DIAGNOSIS — N6489 Other specified disorders of breast: Secondary | ICD-10-CM | POA: Diagnosis not present

## 2021-11-26 DIAGNOSIS — Z1231 Encounter for screening mammogram for malignant neoplasm of breast: Secondary | ICD-10-CM

## 2022-01-19 ENCOUNTER — Emergency Department (HOSPITAL_COMMUNITY): Payer: PPO

## 2022-01-19 ENCOUNTER — Emergency Department (HOSPITAL_COMMUNITY)
Admission: EM | Admit: 2022-01-19 | Discharge: 2022-01-19 | Disposition: A | Discharge status: Home / Self Care | Payer: PPO | Attending: Emergency Medicine | Admitting: Emergency Medicine

## 2022-01-19 ENCOUNTER — Encounter (HOSPITAL_COMMUNITY): Payer: Self-pay | Admitting: Emergency Medicine

## 2022-01-19 DIAGNOSIS — R944 Abnormal results of kidney function studies: Secondary | ICD-10-CM | POA: Insufficient documentation

## 2022-01-19 DIAGNOSIS — N289 Disorder of kidney and ureter, unspecified: Secondary | ICD-10-CM | POA: Insufficient documentation

## 2022-01-19 DIAGNOSIS — R072 Precordial pain: Secondary | ICD-10-CM | POA: Diagnosis not present

## 2022-01-19 DIAGNOSIS — R739 Hyperglycemia, unspecified: Secondary | ICD-10-CM | POA: Insufficient documentation

## 2022-01-19 DIAGNOSIS — R42 Dizziness and giddiness: Secondary | ICD-10-CM | POA: Diagnosis not present

## 2022-01-19 DIAGNOSIS — F430 Acute stress reaction: Secondary | ICD-10-CM | POA: Insufficient documentation

## 2022-01-19 DIAGNOSIS — I10 Essential (primary) hypertension: Secondary | ICD-10-CM | POA: Insufficient documentation

## 2022-01-19 DIAGNOSIS — R0789 Other chest pain: Secondary | ICD-10-CM | POA: Diagnosis present

## 2022-01-19 DIAGNOSIS — Z7982 Long term (current) use of aspirin: Secondary | ICD-10-CM | POA: Diagnosis not present

## 2022-01-19 DIAGNOSIS — R079 Chest pain, unspecified: Secondary | ICD-10-CM | POA: Diagnosis not present

## 2022-01-19 DIAGNOSIS — J9811 Atelectasis: Secondary | ICD-10-CM | POA: Diagnosis not present

## 2022-01-19 DIAGNOSIS — Z853 Personal history of malignant neoplasm of breast: Secondary | ICD-10-CM | POA: Diagnosis not present

## 2022-01-19 DIAGNOSIS — Z79899 Other long term (current) drug therapy: Secondary | ICD-10-CM | POA: Diagnosis not present

## 2022-01-19 DIAGNOSIS — R457 State of emotional shock and stress, unspecified: Secondary | ICD-10-CM | POA: Diagnosis not present

## 2022-01-19 LAB — CBC
HCT: 39.5 % (ref 36.0–46.0)
Hemoglobin: 12.7 g/dL (ref 12.0–15.0)
MCH: 28.7 pg (ref 26.0–34.0)
MCHC: 32.2 g/dL (ref 30.0–36.0)
MCV: 89.2 fL (ref 80.0–100.0)
Platelets: 262 10*3/uL (ref 150–400)
RBC: 4.43 MIL/uL (ref 3.87–5.11)
RDW: 13.7 % (ref 11.5–15.5)
WBC: 9 10*3/uL (ref 4.0–10.5)
nRBC: 0 % (ref 0.0–0.2)

## 2022-01-19 LAB — BASIC METABOLIC PANEL
Anion gap: 7 (ref 5–15)
BUN: 25 mg/dL — ABNORMAL HIGH (ref 8–23)
CO2: 27 mmol/L (ref 22–32)
Calcium: 8.6 mg/dL — ABNORMAL LOW (ref 8.9–10.3)
Chloride: 107 mmol/L (ref 98–111)
Creatinine, Ser: 0.94 mg/dL (ref 0.44–1.00)
GFR, Estimated: >60 mL/min (ref >60)
Glucose, Bld: 201 mg/dL — ABNORMAL HIGH (ref 70–99)
Potassium: 3.2 mmol/L — ABNORMAL LOW (ref 3.5–5.1)
Sodium: 141 mmol/L (ref 135–145)

## 2022-01-19 LAB — TROPONIN I (HIGH SENSITIVITY)
Troponin I (High Sensitivity): 5 ng/L (ref <18)
Troponin I (High Sensitivity): 5 ng/L (ref <18)

## 2022-01-19 NOTE — ED Triage Notes (Addendum)
PT reports chest pain and dizziness episode tonight that she feels was brought on by stressful situation with her son. She reports pain has improved but not gone away. Denies Nausea, vomiting, diaphoresis. EMS gave '324mg'$  aspirin en route.

## 2022-01-19 NOTE — Progress Notes (Signed)
.  Transition of Care [facility]) - Emergency Department Mini Assessment   Patient Details  Name: [name] MRN: [id number] Date of Birth: 1945/08/13  Transition of Care [facility]) CM/SW Contact:    Illene Regulus, LCSW Phone Number: 01/19/2022, 7:25 AM   Clinical Narrative: CSW received a consult for HH/ DME, CSW discussed the case with the EDP who is recommending [facility] services. CSW spoke with pt via telephone to discuss the [facility] recommendations. Pt declined [facility] services and said "I will follow up with my Primary Care Dr Sabra Heck. Pt stated, " I became upset over something I shouldn't have." CSW informed pt if she decided to change her mind she could ask her PCP for [facility] services as well. pt has no DME needs. No additional TOC needs, TOC sign off.     ED Mini Assessment: What brought you to the Emergency Department? : chest pain and dizziness  Barriers to Discharge: No Barriers Identified     Means of departure: Car  Interventions which prevented an admission or readmission: [place] or Services    Patient Contact and Communications        ,          Patient states their goals for this hospitalization and ongoing recovery are:: retrun home , follow up with PCP CMS Medicare.gov Compare Post Acute Care list provided to:: Patient Choice offered to / list presented to : Patient  Admission diagnosis:  chest pain and high blood pressure Patient Active Problem List   Diagnosis Date Noted   Osteopenia with high risk of fracture 02/04/2014   Cancer of central portion of left female breast ([place]) 02/13/2011   PCP:  Suzanne Gravel, MD Pharmacy:   [facility] ORDER) [place], [place] [address] Phone: [phone] Fax: [phone]  [place], [place] [address] Phone: [phone] Fax: [phone]

## 2022-01-19 NOTE — ED Notes (Signed)
Pt ambulated to restroom. Reporting some lightheadedness.

## 2022-01-19 NOTE — ED Provider Notes (Signed)
[name] Provider Note   CSN: [id number] Arrival date & time: 01/19/22  0147     History  Chief Complaint  Patient presents with   Chest Pain    [name] Ask is a 76 y.o. female.   Chest Pain Associated symptoms: no fever   Patient presents with chest pain, shortness of breath and feeling anxious. Patient reports that her son lives with her.  She reports that he called her from the bar intoxicated and stated he was coming home with 2 friends who needed to stay there.  She did not want her son and his friend to stay the house, and she became very anxious.  Soon after she started having chest tightness and dizziness.  She she felt very anxious.  No syncope.  No nausea or vomiting.  No diaphoresis.  No headache or focal weakness.  Denies any known history of CAD.  She is now feeling improved She reports she had otherwise been feeling well at baseline prior to this phone call.  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 Authorizing Provider  aspirin EC 81 MG tablet Take 81 mg by mouth daily. Swallow whole.   Yes [provider]  calcium carbonate (OSCAL) 1500 (600 Ca) MG TABS tablet Take 600 mg of elemental calcium by mouth every other day.   Yes [provider]  cholecalciferol (VITAMIN D3) 25 MCG (1000 UNIT) tablet Take 1,000 Units by mouth daily.   Yes [provider]  latanoprost (XALATAN) 0.005 % ophthalmic solution Place 1 drop into both eyes 2 (two) times daily.  12/10/10  Yes [provider]  lisinopril-hydrochlorothiazide  (PRINZIDE,ZESTORETIC) 20-12.5 MG per tablet Take 1 tablet by mouth every morning.  01/31/11  Yes [provider]  lovastatin (MEVACOR) 40 MG tablet Take 40 mg by mouth every morning.  12/27/10  Yes [provider]  metoprolol (LOPRESSOR) 50 MG tablet Take 50 mg by mouth daily.  01/31/11  Yes [provider]  timolol (TIMOPTIC) 0.5 % ophthalmic solution Place 1 drop into both eyes every morning.  12/31/10  Yes [provider]      Allergies    Zithromax [azithromycin]    Review of Systems   Review of Systems  Constitutional:  Negative for fever.  Cardiovascular:  Positive for chest pain.  Psychiatric/Behavioral:  The patient is nervous/anxious.     Physical Exam Updated Vital Signs BP (!) 147/65   Pulse 73   Temp 98 F (36.7 C) (Oral)   Resp 19   SpO2 95%  Physical Exam CONSTITUTIONAL: Elderly and mildly anxious HEAD: Normocephalic/atraumatic ENMT: Mucous membranes moist NECK: supple no meningeal signs SPINE/BACK:entire spine nontender CV: S1/S2 noted, no murmurs/rubs/gallops noted LUNGS: Lungs are clear to auscultation bilaterally, no apparent distress ABDOMEN: soft, nontender, no rebound or guarding, bowel sounds noted throughout abdomen GU:no cva tenderness NEURO: Pt is awake/alert/appropriate, moves all extremitiesx4.  No facial droop.  No arm or leg drift EXTREMITIES: pulses normal/equal, full ROM  SKIN: warm, color normal PSYCH: no abnormalities of mood noted, alert and oriented to situation  ED Results / Procedures / Treatments   Labs (all labs ordered are listed, but only abnormal results are displayed) Labs Reviewed  BASIC METABOLIC PANEL - Abnormal; Notable for the following components:      Result Value   Potassium 3.2 (*)    Glucose, Bld 201 (*)    BUN 25 (*)    Calcium 8.6 (*)    All other components within normal limits  CBC  TROPONIN I (HIGH SENSITIVITY)  TROPONIN I (HIGH SENSITIVITY)    EKG EKG  Interpretation  Date/Time:  Saturday January 19 2022 02:33:46 EDT Ventricular Rate:  78 PR Interval:  190 QRS Duration: 81 QT Interval:  378 QTC Calculation: 431 R Axis:   30 Text Interpretation: Sinus rhythm Atrial premature complex No significant change was found Confirmed by Ripley Fraise [phone]) on 01/19/2022 2:38:28 AM  Radiology DG Chest 2 View  Result Date: 01/19/2022 CLINICAL DATA:  Chest pain and dizziness. EXAM: CHEST - 2 VIEW COMPARISON:  02/25/2011. FINDINGS: The heart size and mediastinal contours are within normal limits. Mild atelectasis is present at the left lung base. No effusion or pneumothorax. Degenerative changes are present in the thoracic spine. No acute osseous abnormality. IMPRESSION: Mild atelectasis at the left lung base. Electronically Signed   By: Brett Fairy M.D.   On: 01/19/2022 02:27    Procedures Procedures    Medications Ordered in ED Medications - No data to display  ED Course/ Medical Decision Making/ A&P Clinical Course as of 01/19/22 0622  Sat Jan 19, 2022  0234 Patient presents after a stressful situation at home triggered chest pain and shortness of breath.  She is now feeling improved, work-up pending at this time [DW]  0305 BUN(!): 25 [DW]  0305 Glucose(!): 201 Mild renal insufficiency [DW]  0305 Glucose(!): 201 Hyperglycemia [DW]  0332 Initial work-up unremarkable.  Patient feels improved.  We will continue to monitor [DW]  0413 Patient able to ambulate without difficulty [DW]  [phone] Patient improved, no acute distress.  No signs of ACS.  Low suspicion for PE/dissection.  She is awake alert, no acute distress, no arm or leg drift, no facial droop.  Patient is legally blind at baseline.  She has been ambulatory. [DW]  [phone] Patient will call her son to take her home.  Discussed need to follow with PCP next week for blood pressure recheck as it is more elevated than usual likely due to stressful situation [DW]    Clinical Course User  Index [DW] Ripley Fraise, MD           HEART Score: 4                Medical Decision Making Amount and/or Complexity of Data Reviewed Labs: ordered. Decision-making details documented in ED Course. Radiology: ordered. ECG/medicine tests: ordered.   This patient presents to the ED for concern of chest pain, this involves an extensive number of treatment options, and is a complaint that carries with it a high risk of complications and morbidity.  The differential diagnosis includes but is not limited to acute coronary syndrome, aortic dissection, pulmonary embolism, pericarditis, pneumothorax, pneumonia, myocarditis, pleurisy, esophageal rupture    Comorbidities that complicate the patient evaluation: Patient's presentation is complicated by their history of hypertension, obesity  Social Determinants of Health: Patient's  stressful home situation   increases the complexity of managing their presentation  Additional history obtained:  Records reviewed Primary Care Documents  Lab Tests: I Ordered, and personally interpreted labs.  The pertinent results include: Mild hyperglycemia  Imaging Studies ordered: I ordered imaging studies including X-ray chest   I independently visualized and interpreted imaging which showed no acute findings I agree with the radiologist interpretation  Cardiac Monitoring: The patient was maintained on a cardiac monitor.  I personally viewed and interpreted the cardiac monitor which showed an underlying rhythm of:  sinus rhythm   Reevaluation: After the interventions noted above, I reevaluated the patient and found that they have :improved  Complexity of problems addressed: Patient's presentation is most consistent with  acute presentation with potential threat to life or bodily function  Disposition: After consideration of the diagnostic results and the patient's response to treatment,  I feel that the patent would benefit from discharge   .            Final Clinical Impression(s) / ED Diagnoses Final diagnoses:  Precordial pain  Acute stress reaction    Rx / DC Orders ED Discharge Orders     None         Ripley Fraise, MD 01/19/22 [phone]

## 2022-01-19 NOTE — ED Notes (Signed)
Son Roderic Palau called to pick up pt. Son reports he will be here shortly to get her.

## 2022-01-19 NOTE — ED Provider Notes (Signed)
Patient's son has arrived.  He reports that she has had recent memory issues and will get upset frequently.  They are amenable to having an home health care.  Patient's son feels comfortable taking her home.  Patient appears safe to go home  patient walked out the Emergency Department without difficulty.   Ripley Fraise, MD 01/19/22 [phone]

## 2022-01-19 NOTE — Discharge Instructions (Signed)

## 2022-01-19 NOTE — ED Notes (Signed)
PT ambulated to restroom. States she feels a little weak but denies dizziness like before. RN walked next to her.

## 2022-01-25 DIAGNOSIS — F430 Acute stress reaction: Secondary | ICD-10-CM | POA: Diagnosis not present

## 2022-03-04 DIAGNOSIS — Z1389 Encounter for screening for other disorder: Secondary | ICD-10-CM | POA: Diagnosis not present

## 2022-03-04 DIAGNOSIS — H401134 Primary open-angle glaucoma, bilateral, indeterminate stage: Secondary | ICD-10-CM | POA: Diagnosis not present

## 2022-03-04 DIAGNOSIS — Z23 Encounter for immunization: Secondary | ICD-10-CM | POA: Diagnosis not present

## 2022-03-04 DIAGNOSIS — Z0000 Encounter for general adult medical examination without abnormal findings: Secondary | ICD-10-CM | POA: Diagnosis not present

## 2022-03-04 DIAGNOSIS — Z6834 Body mass index (BMI) 34.0-34.9, adult: Secondary | ICD-10-CM | POA: Diagnosis not present

## 2022-07-08 DIAGNOSIS — H401134 Primary open-angle glaucoma, bilateral, indeterminate stage: Secondary | ICD-10-CM | POA: Diagnosis not present

## 2022-08-12 DIAGNOSIS — E782 Mixed hyperlipidemia: Secondary | ICD-10-CM | POA: Diagnosis not present

## 2022-08-12 DIAGNOSIS — E119 Type 2 diabetes mellitus without complications: Secondary | ICD-10-CM | POA: Diagnosis not present

## 2022-08-12 DIAGNOSIS — R2689 Other abnormalities of gait and mobility: Secondary | ICD-10-CM | POA: Diagnosis not present

## 2022-08-12 DIAGNOSIS — E669 Obesity, unspecified: Secondary | ICD-10-CM | POA: Diagnosis not present

## 2022-08-12 DIAGNOSIS — E1169 Type 2 diabetes mellitus with other specified complication: Secondary | ICD-10-CM | POA: Diagnosis not present

## 2022-08-12 DIAGNOSIS — I10 Essential (primary) hypertension: Secondary | ICD-10-CM | POA: Diagnosis not present

## 2022-08-12 DIAGNOSIS — Z6834 Body mass index (BMI) 34.0-34.9, adult: Secondary | ICD-10-CM | POA: Diagnosis not present

## 2022-11-18 DIAGNOSIS — H401134 Primary open-angle glaucoma, bilateral, indeterminate stage: Secondary | ICD-10-CM | POA: Diagnosis not present

## 2023-01-13 DIAGNOSIS — E119 Type 2 diabetes mellitus without complications: Secondary | ICD-10-CM | POA: Diagnosis not present

## 2023-02-17 DIAGNOSIS — I10 Essential (primary) hypertension: Secondary | ICD-10-CM | POA: Diagnosis not present

## 2023-02-17 DIAGNOSIS — I700 Atherosclerosis of aorta: Secondary | ICD-10-CM | POA: Diagnosis not present

## 2023-02-17 DIAGNOSIS — H4089 Other specified glaucoma: Secondary | ICD-10-CM | POA: Diagnosis not present

## 2023-02-17 DIAGNOSIS — Z6834 Body mass index (BMI) 34.0-34.9, adult: Secondary | ICD-10-CM | POA: Diagnosis not present

## 2023-02-17 DIAGNOSIS — E669 Obesity, unspecified: Secondary | ICD-10-CM | POA: Diagnosis not present

## 2023-02-17 DIAGNOSIS — E1165 Type 2 diabetes mellitus with hyperglycemia: Secondary | ICD-10-CM | POA: Diagnosis not present

## 2023-04-21 DIAGNOSIS — H401134 Primary open-angle glaucoma, bilateral, indeterminate stage: Secondary | ICD-10-CM | POA: Diagnosis not present

## 2023-06-12 ENCOUNTER — Other Ambulatory Visit (HOSPITAL_COMMUNITY): Payer: Self-pay

## 2023-06-12 ENCOUNTER — Other Ambulatory Visit: Payer: Self-pay

## 2023-06-12 MED ORDER — OZEMPIC (0.25 OR 0.5 MG/DOSE) 2 MG/3ML ~~LOC~~ SOPN
PEN_INJECTOR | SUBCUTANEOUS | 1 refills | Status: AC
  Filled 2023-06-12: qty 3, 56d supply, fill #0
  Filled 2023-07-14: qty 3, 28d supply, fill #0

## 2023-06-12 MED ORDER — OZEMPIC (0.25 OR 0.5 MG/DOSE) 2 MG/3ML ~~LOC~~ SOPN
0.2500 mg | PEN_INJECTOR | SUBCUTANEOUS | 1 refills | Status: AC
  Filled 2023-06-12: qty 3, 56d supply, fill #0
  Filled 2023-07-14: qty 3, 56d supply, fill #1

## 2023-06-13 ENCOUNTER — Other Ambulatory Visit (HOSPITAL_COMMUNITY): Payer: Self-pay

## 2023-07-14 ENCOUNTER — Other Ambulatory Visit (HOSPITAL_COMMUNITY): Payer: Self-pay

## 2023-07-15 ENCOUNTER — Other Ambulatory Visit (HOSPITAL_COMMUNITY): Payer: Self-pay

## 2023-07-15 MED ORDER — OZEMPIC (0.25 OR 0.5 MG/DOSE) 2 MG/3ML ~~LOC~~ SOPN: PEN_INJECTOR | SUBCUTANEOUS | 0 refills | Status: AC

## 2023-07-16 ENCOUNTER — Other Ambulatory Visit (HOSPITAL_COMMUNITY): Payer: Self-pay

## 2023-07-18 ENCOUNTER — Other Ambulatory Visit (HOSPITAL_BASED_OUTPATIENT_CLINIC_OR_DEPARTMENT_OTHER): Payer: Self-pay

## 2023-07-18 MED ORDER — OZEMPIC (0.25 OR 0.5 MG/DOSE) 2 MG/3ML ~~LOC~~ SOPN
0.5000 mg | PEN_INJECTOR | SUBCUTANEOUS | 0 refills | Status: AC
  Filled 2023-07-18 – 2023-07-25 (×3): qty 3, 28d supply, fill #0

## 2023-07-23 ENCOUNTER — Other Ambulatory Visit (HOSPITAL_COMMUNITY): Payer: Self-pay

## 2023-07-25 ENCOUNTER — Other Ambulatory Visit (HOSPITAL_COMMUNITY): Payer: Self-pay

## 2023-07-25 ENCOUNTER — Other Ambulatory Visit: Payer: Self-pay

## 2023-07-31 ENCOUNTER — Other Ambulatory Visit (HOSPITAL_COMMUNITY): Payer: Self-pay

## 2023-07-31 MED ORDER — OZEMPIC (0.25 OR 0.5 MG/DOSE) 2 MG/3ML ~~LOC~~ SOPN
0.5000 mg | PEN_INJECTOR | SUBCUTANEOUS | 0 refills | Status: DC
Start: 2023-07-31 — End: 2023-08-26
  Filled 2023-07-31: qty 3, 28d supply, fill #0

## 2023-08-15 ENCOUNTER — Other Ambulatory Visit (HOSPITAL_COMMUNITY): Payer: Self-pay

## 2023-08-20 ENCOUNTER — Other Ambulatory Visit (HOSPITAL_COMMUNITY): Payer: Self-pay

## 2023-08-25 DIAGNOSIS — E782 Mixed hyperlipidemia: Secondary | ICD-10-CM | POA: Diagnosis not present

## 2023-08-25 DIAGNOSIS — E1165 Type 2 diabetes mellitus with hyperglycemia: Secondary | ICD-10-CM | POA: Diagnosis not present

## 2023-08-25 DIAGNOSIS — Z6833 Body mass index (BMI) 33.0-33.9, adult: Secondary | ICD-10-CM | POA: Diagnosis not present

## 2023-08-25 DIAGNOSIS — Z1331 Encounter for screening for depression: Secondary | ICD-10-CM | POA: Diagnosis not present

## 2023-08-25 DIAGNOSIS — I10 Essential (primary) hypertension: Secondary | ICD-10-CM | POA: Diagnosis not present

## 2023-08-25 DIAGNOSIS — E669 Obesity, unspecified: Secondary | ICD-10-CM | POA: Diagnosis not present

## 2023-08-25 DIAGNOSIS — Z0000 Encounter for general adult medical examination without abnormal findings: Secondary | ICD-10-CM | POA: Diagnosis not present

## 2023-08-26 ENCOUNTER — Other Ambulatory Visit (HOSPITAL_COMMUNITY): Payer: Self-pay

## 2023-08-26 MED ORDER — OZEMPIC (1 MG/DOSE) 4 MG/3ML ~~LOC~~ SOPN
1.0000 mg | PEN_INJECTOR | SUBCUTANEOUS | 0 refills | Status: DC
Start: 2023-08-26 — End: 2023-11-26
  Filled 2023-08-26: qty 9, 84d supply, fill #0

## 2023-09-05 ENCOUNTER — Other Ambulatory Visit (HOSPITAL_COMMUNITY): Payer: Self-pay

## 2023-09-22 DIAGNOSIS — H401134 Primary open-angle glaucoma, bilateral, indeterminate stage: Secondary | ICD-10-CM | POA: Diagnosis not present

## 2023-11-04 DIAGNOSIS — H442E3 Degenerative myopia with other maculopathy, bilateral eye: Secondary | ICD-10-CM | POA: Diagnosis not present

## 2023-11-04 DIAGNOSIS — H401134 Primary open-angle glaucoma, bilateral, indeterminate stage: Secondary | ICD-10-CM | POA: Diagnosis not present

## 2023-11-04 DIAGNOSIS — H18423 Band keratopathy, bilateral: Secondary | ICD-10-CM | POA: Diagnosis not present

## 2023-11-04 DIAGNOSIS — H1820 Unspecified corneal edema: Secondary | ICD-10-CM | POA: Diagnosis not present

## 2023-11-04 DIAGNOSIS — H2703 Aphakia, bilateral: Secondary | ICD-10-CM | POA: Diagnosis not present

## 2023-11-26 ENCOUNTER — Other Ambulatory Visit (HOSPITAL_COMMUNITY): Payer: Self-pay

## 2023-11-26 MED ORDER — OZEMPIC (1 MG/DOSE) 4 MG/3ML ~~LOC~~ SOPN
1.0000 mg | PEN_INJECTOR | SUBCUTANEOUS | 0 refills | Status: AC
  Filled 2023-11-26 – 2023-12-08 (×2): qty 9, 84d supply, fill #0

## 2023-12-08 ENCOUNTER — Other Ambulatory Visit (HOSPITAL_COMMUNITY): Payer: Self-pay

## 2024-03-15 DIAGNOSIS — Z6831 Body mass index (BMI) 31.0-31.9, adult: Secondary | ICD-10-CM | POA: Diagnosis not present

## 2024-03-15 DIAGNOSIS — E782 Mixed hyperlipidemia: Secondary | ICD-10-CM | POA: Diagnosis not present

## 2024-03-15 DIAGNOSIS — E669 Obesity, unspecified: Secondary | ICD-10-CM | POA: Diagnosis not present

## 2024-03-15 DIAGNOSIS — I129 Hypertensive chronic kidney disease with stage 1 through stage 4 chronic kidney disease, or unspecified chronic kidney disease: Secondary | ICD-10-CM | POA: Diagnosis not present

## 2024-03-15 DIAGNOSIS — R6889 Other general symptoms and signs: Secondary | ICD-10-CM | POA: Diagnosis not present

## 2024-03-15 DIAGNOSIS — E1122 Type 2 diabetes mellitus with diabetic chronic kidney disease: Secondary | ICD-10-CM | POA: Diagnosis not present

## 2024-03-15 DIAGNOSIS — N1831 Chronic kidney disease, stage 3a: Secondary | ICD-10-CM | POA: Diagnosis not present
